# Patient Record
Sex: Female | Born: 1980
Health system: Southern US, Community
[De-identification: ages and names within clinical notes are randomized; demographics above are authoritative.]

## PROBLEM LIST (undated history)

## (undated) DIAGNOSIS — R32 Unspecified urinary incontinence: Secondary | ICD-10-CM

## (undated) DIAGNOSIS — N390 Urinary tract infection, site not specified: Secondary | ICD-10-CM

## (undated) DIAGNOSIS — R011 Cardiac murmur, unspecified: Secondary | ICD-10-CM

## (undated) DIAGNOSIS — K219 Gastro-esophageal reflux disease without esophagitis: Secondary | ICD-10-CM

## (undated) DIAGNOSIS — Z8719 Personal history of other diseases of the digestive system: Secondary | ICD-10-CM

## (undated) HISTORY — DX: Urinary tract infection, site not specified: N39.0

## (undated) HISTORY — DX: Unspecified urinary incontinence: R32

## (undated) HISTORY — PX: LIPOSUCTION: SHX10

## (undated) HISTORY — PX: CYSTOSCOPY: SUR368

## (undated) HISTORY — PX: MYRINGOTOMY: SUR874

## (undated) HISTORY — DX: Cardiac murmur, unspecified: R01.1

## (undated) HISTORY — PX: REDUCTION MAMMAPLASTY: SUR839

---

## 1999-04-06 HISTORY — PX: BREAST REDUCTION SURGERY: SHX8

## 1999-04-06 HISTORY — PX: REDUCTION MAMMAPLASTY: SUR839

## 1999-05-04 ENCOUNTER — Other Ambulatory Visit: Admission: RE | Admit: 1999-05-04 | Discharge: 1999-05-04 | Payer: Self-pay | Admitting: Obstetrics and Gynecology

## 1999-11-16 ENCOUNTER — Other Ambulatory Visit: Admission: RE | Admit: 1999-11-16 | Discharge: 1999-11-16 | Payer: Self-pay | Admitting: Plastic Surgery

## 2000-03-16 ENCOUNTER — Ambulatory Visit (HOSPITAL_COMMUNITY): Admission: RE | Admit: 2000-03-16 | Discharge: 2000-03-16 | Payer: Self-pay | Admitting: Gastroenterology

## 2000-03-16 ENCOUNTER — Encounter: Payer: Self-pay | Admitting: Gastroenterology

## 2000-04-15 ENCOUNTER — Other Ambulatory Visit: Admission: RE | Admit: 2000-04-15 | Discharge: 2000-04-15 | Payer: Self-pay | Admitting: Gastroenterology

## 2000-05-11 ENCOUNTER — Other Ambulatory Visit: Admission: RE | Admit: 2000-05-11 | Discharge: 2000-05-11 | Payer: Self-pay | Admitting: Obstetrics and Gynecology

## 2000-08-11 ENCOUNTER — Emergency Department (HOSPITAL_COMMUNITY): Admission: EM | Admit: 2000-08-11 | Discharge: 2000-08-11 | Payer: Self-pay | Admitting: Internal Medicine

## 2001-05-15 ENCOUNTER — Other Ambulatory Visit: Admission: RE | Admit: 2001-05-15 | Discharge: 2001-05-15 | Payer: Self-pay | Admitting: Obstetrics and Gynecology

## 2001-05-17 ENCOUNTER — Ambulatory Visit (HOSPITAL_COMMUNITY): Admission: RE | Admit: 2001-05-17 | Discharge: 2001-05-17 | Payer: Self-pay | Admitting: Obstetrics and Gynecology

## 2002-06-22 ENCOUNTER — Other Ambulatory Visit: Admission: RE | Admit: 2002-06-22 | Discharge: 2002-06-22 | Payer: Self-pay | Admitting: Obstetrics and Gynecology

## 2003-07-15 ENCOUNTER — Other Ambulatory Visit: Admission: RE | Admit: 2003-07-15 | Discharge: 2003-07-15 | Payer: Self-pay | Admitting: Obstetrics and Gynecology

## 2004-01-16 ENCOUNTER — Other Ambulatory Visit: Admission: RE | Admit: 2004-01-16 | Discharge: 2004-01-16 | Payer: Self-pay | Admitting: Obstetrics and Gynecology

## 2004-08-07 ENCOUNTER — Other Ambulatory Visit: Admission: RE | Admit: 2004-08-07 | Discharge: 2004-08-07 | Payer: Self-pay | Admitting: Obstetrics and Gynecology

## 2006-01-25 ENCOUNTER — Encounter: Admission: RE | Admit: 2006-01-25 | Discharge: 2006-01-25 | Payer: Self-pay | Admitting: Internal Medicine

## 2011-06-21 ENCOUNTER — Other Ambulatory Visit: Payer: Self-pay | Admitting: Obstetrics and Gynecology

## 2012-08-21 ENCOUNTER — Telehealth: Payer: Self-pay | Admitting: Nurse Practitioner

## 2012-08-21 ENCOUNTER — Telehealth: Payer: Self-pay | Admitting: *Deleted

## 2012-08-21 NOTE — Telephone Encounter (Signed)
SEE CALL NOTES !!!

## 2012-08-21 NOTE — Telephone Encounter (Signed)
error 

## 2012-08-22 ENCOUNTER — Encounter: Payer: Self-pay | Admitting: *Deleted

## 2012-08-22 ENCOUNTER — Emergency Department
Admission: EM | Admit: 2012-08-22 | Discharge: 2012-08-22 | Disposition: A | Discharge status: Home / Self Care | Payer: 59 | Source: Home / Self Care | Attending: Family Medicine | Admitting: Family Medicine

## 2012-08-22 DIAGNOSIS — J209 Acute bronchitis, unspecified: Secondary | ICD-10-CM

## 2012-08-22 MED ORDER — BENZONATATE 200 MG PO CAPS: 200.0000 mg | ORAL_CAPSULE | Freq: Every day | ORAL | Status: DC

## 2012-08-22 MED ORDER — DOXYCYCLINE HYCLATE 100 MG PO CAPS: 100.0000 mg | ORAL_CAPSULE | Freq: Two times a day (BID) | ORAL | Status: DC

## 2012-08-22 NOTE — ED Notes (Signed)
Katelyn Mccoy c/o URI x 1 1/2 weeks. Cough is productive, with low grade fever. Taken tylenol cold/sinus

## 2012-08-22 NOTE — ED Provider Notes (Signed)
History     CSN: [id number]  Arrival date & time 08/22/12  0845   First MD Initiated Contact with Patient 08/22/12 [phone]      Chief Complaint  Patient presents with  . URI  . Generalized Body Aches      HPI Comments: Patient complains of approximately 1.5 week history of gradually progressive URI symptoms beginning with a mild sore throat (now resolved), followed by progressive nasal congestion.  A cough started next.  Complains of fatigue and initial myalgias.  Cough is now worse at night and generally non-productive during the day.  There has been no pleuritic pain, but she complains of tightness in her anterior chest.  Over the past two days she has felt worse with increased fatigue and development of low grade fever/chills.  She has developed shortness of breath with activity and wheezing.  She continues to smoke.   The history is provided by the patient.    History reviewed. No pertinent past medical history.  Past Surgical History  Procedure Laterality Date  . Breast reduction surgery    . Myringotomy      Family History  Problem Relation Age of Onset  . Diabetes Other   . Hyperlipidemia Other   . Hypertension Other     History  Substance Use Topics  . Smoking status: Current Every Day Smoker -- 0.50 packs/day    Types: Cigarettes  . Smokeless tobacco: Never Used  . Alcohol Use: Yes    OB History   Grav Para Term Preterm Abortions TAB SAB Ect Mult Living                  Review of Systems + sore throat + cough No pleuritic pain, but has tightness in anterior chest. + wheezing + nasal congestion + post-nasal drainage No sinus pain/pressure No itchy/red eyes No earache No hemoptysis + SOB + low grade fever, + chills No nausea No vomiting No abdominal pain No diarrhea No urinary symptoms No skin rashes + fatigue + myalgias + headache Used OTC meds without relief  Allergies  Sulfur  Home Medications   Current Outpatient Rx  Name  Route   Sig  Dispense  Refill  . omeprazole-sodium bicarbonate (ZEGERID) 40-1100 MG per capsule   Oral   Take 1 capsule by mouth daily before breakfast.         . benzonatate (TESSALON) 200 MG capsule   Oral   Take 1 capsule (200 mg total) by mouth at bedtime.   12 capsule   0   . doxycycline (VIBRAMYCIN) 100 MG capsule   Oral   Take 1 capsule (100 mg total) by mouth 2 (two) times daily.   20 capsule   0     BP 126/84  Pulse 70  Temp(Src) 98 F (36.7 C) (Oral)  Resp 16  Ht 5\' 2"  (1.575 m)  Wt 149 lb (67.586 kg)  BMI 27.25 kg/m2  SpO2 100%  LMP 08/21/2012  Physical Exam Nursing notes and Vital Signs reviewed. Appearance:  Patient appears healthy, stated age, and in no acute distress Eyes:  Pupils are equal, round, and reactive to light and accomodation.  Extraocular movement is intact.  Conjunctivae are not inflamed  Ears:  Canals normal.  Tympanic membranes normal.  Nose:  Mildly congested turbinates.  No sinus tenderness.  Pharynx:  Normal Neck:  Supple.  Slightly tender shotty posterior nodes are palpated bilaterally  Lungs:   Scattered rhonchi.  Breath sounds are equal.  Heart:  Regular rate and rhythm without murmurs, rubs, or gallops.  Abdomen:  Nontender without masses or hepatosplenomegaly.  Bowel sounds are present.  No CVA or flank tenderness.  Extremities:  No edema.  No calf tenderness Skin:  No rash present.    ED Course  Procedures  none      1. Acute bronchitis; suspect initial viral URI now with secondary bacterial infection       MDM  Begin doxycycline.  Prescription written for Benzonatate [facility]) to take at bedtime for night-time cough.  Take plain Mucinex (guaifenesin) twice daily for cough and congestion.  Increase fluid intake, rest. May use Afrin nasal spray (or generic oxymetazoline) twice daily for about 5 days.  Also recommend using saline nasal spray several times daily and saline nasal irrigation (AYR is a common brand) Stop all  antihistamines for now, and other non-prescription cough/cold preparations. Follow-up with family doctor if not improving 7 to 10 days.         Lattie Haw, MD 08/25/12 [phone]

## 2012-08-26 ENCOUNTER — Telehealth: Payer: Self-pay | Admitting: Emergency Medicine

## 2012-10-12 ENCOUNTER — Emergency Department (HOSPITAL_COMMUNITY)
Admission: EM | Admit: 2012-10-12 | Discharge: 2012-10-12 | Disposition: A | Discharge status: Home / Self Care | Payer: 59 | Source: Home / Self Care

## 2013-11-23 DIAGNOSIS — M79609 Pain in unspecified limb: Secondary | ICD-10-CM

## 2014-10-02 ENCOUNTER — Other Ambulatory Visit: Payer: Self-pay | Admitting: Obstetrics and Gynecology

## 2014-10-03 LAB — CYTOLOGY - PAP

## 2014-11-13 ENCOUNTER — Other Ambulatory Visit: Payer: Self-pay | Admitting: Obstetrics and Gynecology

## 2014-11-13 DIAGNOSIS — R928 Other abnormal and inconclusive findings on diagnostic imaging of breast: Secondary | ICD-10-CM

## 2014-11-22 ENCOUNTER — Ambulatory Visit
Admission: RE | Admit: 2014-11-22 | Discharge: 2014-11-22 | Disposition: A | Discharge status: Home / Self Care | Payer: 59 | Source: Ambulatory Visit | Attending: Obstetrics and Gynecology | Admitting: Obstetrics and Gynecology

## 2014-11-22 DIAGNOSIS — R928 Other abnormal and inconclusive findings on diagnostic imaging of breast: Secondary | ICD-10-CM

## 2015-01-09 ENCOUNTER — Telehealth: Payer: Self-pay | Admitting: *Deleted

## 2015-01-09 MED ORDER — METHYLPREDNISOLONE 4 MG PO TBPK: ORAL_TABLET | ORAL | Status: DC

## 2015-01-09 MED ORDER — AZITHROMYCIN 250 MG PO TABS: ORAL_TABLET | ORAL | Status: DC

## 2015-01-09 NOTE — Telephone Encounter (Signed)
Dr. Milinda Pointer ordered Z-pack, and Medrol dose pack 6 days.

## 2015-03-10 ENCOUNTER — Telehealth: Payer: Self-pay | Admitting: Podiatry

## 2015-03-10 MED ORDER — FLUCONAZOLE 150 MG PO TABS: 150.0000 mg | ORAL_TABLET | Freq: Once | ORAL | Status: DC

## 2015-07-03 MED FILL — AZITHROMYCIN 250 MG TABLET: 250 | 5 days supply | Qty: 6 | Fill #0

## 2015-08-21 ENCOUNTER — Other Ambulatory Visit: Payer: Self-pay | Admitting: *Deleted

## 2015-08-21 MED ORDER — ITRACONAZOLE 100 MG PO CAPS: ORAL_CAPSULE | ORAL | Status: DC

## 2015-08-25 DIAGNOSIS — R52 Pain, unspecified: Secondary | ICD-10-CM | POA: Diagnosis not present

## 2015-08-25 DIAGNOSIS — L905 Scar conditions and fibrosis of skin: Secondary | ICD-10-CM | POA: Diagnosis not present

## 2015-08-25 DIAGNOSIS — L304 Erythema intertrigo: Secondary | ICD-10-CM | POA: Diagnosis not present

## 2015-09-26 MED ORDER — AZITHROMYCIN 250 MG PO TABS: ORAL_TABLET | ORAL | Status: DC

## 2015-10-24 ENCOUNTER — Other Ambulatory Visit: Payer: Self-pay

## 2015-10-24 MED ORDER — AZITHROMYCIN 250 MG PO TABS: ORAL_TABLET | ORAL | Status: DC

## 2015-12-05 DIAGNOSIS — L905 Scar conditions and fibrosis of skin: Secondary | ICD-10-CM | POA: Diagnosis not present

## 2015-12-15 ENCOUNTER — Ambulatory Visit (HOSPITAL_COMMUNITY): Payer: Self-pay | Admitting: Plastic Surgery

## 2015-12-15 ENCOUNTER — Other Ambulatory Visit (HOSPITAL_COMMUNITY): Payer: Self-pay | Admitting: Plastic Surgery

## 2015-12-15 DIAGNOSIS — L905 Scar conditions and fibrosis of skin: Secondary | ICD-10-CM | POA: Diagnosis not present

## 2015-12-24 ENCOUNTER — Encounter (HOSPITAL_COMMUNITY): Payer: Self-pay | Admitting: *Deleted

## 2015-12-24 MED ORDER — CEFAZOLIN SODIUM-DEXTROSE 2-4 GM/100ML-% IV SOLN
2.0000 g | INTRAVENOUS | Status: AC
  Administered 2015-12-25: 2 g via INTRAVENOUS
  Filled 2015-12-24: qty 100

## 2015-12-24 MED ORDER — HEPARIN SODIUM (PORCINE) 5000 UNIT/ML IJ SOLN
5000.0000 [IU] | Freq: Once | INTRAMUSCULAR | Status: AC
  Administered 2015-12-25: 5000 [IU] via SUBCUTANEOUS
  Filled 2015-12-24: qty 1

## 2015-12-24 NOTE — Progress Notes (Signed)
Spoke with pt for pre-op call, pt denies cardiac history, chest pain or sob.

## 2015-12-25 ENCOUNTER — Ambulatory Visit (HOSPITAL_COMMUNITY): Payer: 59 | Admitting: Anesthesiology

## 2015-12-25 ENCOUNTER — Encounter (HOSPITAL_COMMUNITY): Payer: Self-pay | Admitting: *Deleted

## 2015-12-25 ENCOUNTER — Encounter (HOSPITAL_COMMUNITY)
Admission: RE | Disposition: A | Discharge status: Home / Self Care | Payer: Self-pay | Source: Ambulatory Visit | Attending: Plastic Surgery

## 2015-12-25 ENCOUNTER — Ambulatory Visit (HOSPITAL_COMMUNITY)
Admission: RE | Admit: 2015-12-25 | Discharge: 2015-12-25 | Disposition: A | Discharge status: Home / Self Care | Payer: 59 | Source: Ambulatory Visit | Attending: Plastic Surgery | Admitting: Plastic Surgery

## 2015-12-25 DIAGNOSIS — Z87891 Personal history of nicotine dependence: Secondary | ICD-10-CM | POA: Insufficient documentation

## 2015-12-25 DIAGNOSIS — K219 Gastro-esophageal reflux disease without esophagitis: Secondary | ICD-10-CM | POA: Insufficient documentation

## 2015-12-25 DIAGNOSIS — L910 Hypertrophic scar: Secondary | ICD-10-CM | POA: Diagnosis not present

## 2015-12-25 DIAGNOSIS — L905 Scar conditions and fibrosis of skin: Secondary | ICD-10-CM | POA: Insufficient documentation

## 2015-12-25 DIAGNOSIS — L304 Erythema intertrigo: Secondary | ICD-10-CM | POA: Diagnosis not present

## 2015-12-25 HISTORY — PX: PANNICULECTOMY: SHX5360

## 2015-12-25 HISTORY — DX: Personal history of other diseases of the digestive system: Z87.19

## 2015-12-25 HISTORY — DX: Gastro-esophageal reflux disease without esophagitis: K21.9

## 2015-12-25 LAB — HCG, SERUM, QUALITATIVE: Preg, Serum: NEGATIVE

## 2015-12-25 LAB — CBC
HCT: 39.8 % (ref 36.0–46.0)
Hemoglobin: 13.4 g/dL (ref 12.0–15.0)
MCH: 29.7 pg (ref 26.0–34.0)
MCHC: 33.7 g/dL (ref 30.0–36.0)
MCV: 88.2 fL (ref 78.0–100.0)
Platelets: 226 10*3/uL (ref 150–400)
RBC: 4.51 MIL/uL (ref 3.87–5.11)
RDW: 12.9 % (ref 11.5–15.5)
WBC: 7.2 10*3/uL (ref 4.0–10.5)

## 2015-12-25 SURGERY — PANNICULECTOMY
Anesthesia: General | Site: Abdomen

## 2015-12-25 MED ORDER — FENTANYL CITRATE (PF) 100 MCG/2ML IJ SOLN
25.0000 ug | INTRAMUSCULAR | Status: DC | PRN
  Administered 2015-12-25 (×2): 50 ug via INTRAVENOUS

## 2015-12-25 MED ORDER — FENTANYL CITRATE (PF) 100 MCG/2ML IJ SOLN
INTRAMUSCULAR | Status: AC
  Administered 2015-12-25: 50 ug via INTRAVENOUS
  Filled 2015-12-25: qty 2

## 2015-12-25 MED ORDER — PROPOFOL 10 MG/ML IV BOLUS
INTRAVENOUS | Status: DC | PRN
Start: 2015-12-25 — End: 2015-12-25
  Administered 2015-12-25: 180 mg via INTRAVENOUS

## 2015-12-25 MED ORDER — 0.9 % SODIUM CHLORIDE (POUR BTL) OPTIME
TOPICAL | Status: DC | PRN
  Administered 2015-12-25: 1000 mL

## 2015-12-25 MED ORDER — LACTATED RINGERS IV SOLN
INTRAVENOUS | Status: DC | PRN
  Administered 2015-12-25 (×2): via INTRAVENOUS

## 2015-12-25 MED ORDER — ONDANSETRON HCL 4 MG/2ML IJ SOLN
INTRAMUSCULAR | Status: DC | PRN
  Administered 2015-12-25: 4 mg via INTRAVENOUS

## 2015-12-25 MED ORDER — MIDAZOLAM HCL 5 MG/5ML IJ SOLN
INTRAMUSCULAR | Status: DC | PRN
  Administered 2015-12-25: 2 mg via INTRAVENOUS

## 2015-12-25 MED ORDER — MIDAZOLAM HCL 2 MG/2ML IJ SOLN
INTRAMUSCULAR | Status: AC
  Filled 2015-12-25: qty 2

## 2015-12-25 MED ORDER — LIDOCAINE 2% (20 MG/ML) 5 ML SYRINGE
INTRAMUSCULAR | Status: AC
  Filled 2015-12-25: qty 5

## 2015-12-25 MED ORDER — PROMETHAZINE HCL 25 MG/ML IJ SOLN: 6.2500 mg | INTRAMUSCULAR | Status: DC | PRN

## 2015-12-25 MED ORDER — FENTANYL CITRATE (PF) 100 MCG/2ML IJ SOLN
INTRAMUSCULAR | Status: AC
  Filled 2015-12-25: qty 4

## 2015-12-25 MED ORDER — CHLORHEXIDINE GLUCONATE CLOTH 2 % EX PADS: 6.0000 | MEDICATED_PAD | Freq: Once | CUTANEOUS | Status: DC

## 2015-12-25 MED ORDER — FENTANYL CITRATE (PF) 100 MCG/2ML IJ SOLN
INTRAMUSCULAR | Status: DC | PRN
  Administered 2015-12-25 (×2): 50 ug via INTRAVENOUS

## 2015-12-25 MED ORDER — LIDOCAINE HCL (CARDIAC) 20 MG/ML IV SOLN
INTRAVENOUS | Status: DC | PRN
  Administered 2015-12-25: 50 mg via INTRAVENOUS

## 2015-12-25 MED ORDER — PROPOFOL 10 MG/ML IV BOLUS
INTRAVENOUS | Status: AC
  Filled 2015-12-25: qty 20

## 2015-12-25 MED ORDER — DEXAMETHASONE SODIUM PHOSPHATE 10 MG/ML IJ SOLN
INTRAMUSCULAR | Status: DC | PRN
  Administered 2015-12-25: 10 mg via INTRAVENOUS

## 2015-12-25 SURGICAL SUPPLY — 67 items
ADH SKN CLS APL DERMABOND .7 (GAUZE/BANDAGES/DRESSINGS) ×1
APPLIER CLIP 9.375 MED OPEN (MISCELLANEOUS)
ATCH SMKEVC FLXB CAUT HNDSWH (FILTER) ×1 IMPLANT
BINDER ABD UNIV 10 28-50 (GAUZE/BANDAGES/DRESSINGS) ×1 IMPLANT
BINDER ABDOM UNIV 10 (GAUZE/BANDAGES/DRESSINGS) ×2
BIOPATCH RED 1 DISK 7.0 (GAUZE/BANDAGES/DRESSINGS) ×2 IMPLANT
BLADE 10 SAFETY STRL DISP (BLADE) IMPLANT
BLADE SURG 15 STRL LF DISP TIS (BLADE) IMPLANT
BLADE SURG 15 STRL SS (BLADE)
BLADE SURG ROTATE 9660 (MISCELLANEOUS) IMPLANT
CANISTER SUCTION 2500CC (MISCELLANEOUS) ×2 IMPLANT
CHLORAPREP W/TINT 26ML (MISCELLANEOUS) ×2 IMPLANT
CLIP APPLIE 9.375 MED OPEN (MISCELLANEOUS) IMPLANT
COVER SURGICAL LIGHT HANDLE (MISCELLANEOUS) ×2 IMPLANT
DERMABOND ADVANCED (GAUZE/BANDAGES/DRESSINGS) ×1
DERMABOND ADVANCED .7 DNX12 (GAUZE/BANDAGES/DRESSINGS) ×1 IMPLANT
DRAIN CHANNEL 19F RND (DRAIN) ×2 IMPLANT
DRAPE LAPAROSCOPIC ABDOMINAL (DRAPES) ×2 IMPLANT
DRAPE ORTHO SPLIT 77X108 STRL (DRAPES)
DRAPE PROXIMA HALF (DRAPES) IMPLANT
DRAPE SURG 17X11 SM STRL (DRAPES) ×4 IMPLANT
DRAPE SURG ORHT 6 SPLT 77X108 (DRAPES) IMPLANT
DRAPE WARM FLUID 44X44 (DRAPE) IMPLANT
DRSG PAD ABDOMINAL 8X10 ST (GAUZE/BANDAGES/DRESSINGS) ×4 IMPLANT
DRSG SORBAVIEW 3.5X5-5/16 MED (GAUZE/BANDAGES/DRESSINGS) ×2 IMPLANT
ELECT CAUTERY BLADE 6.4 (BLADE) ×2 IMPLANT
ELECT REM PT RETURN 9FT ADLT (ELECTROSURGICAL) ×2
ELECTRODE REM PT RTRN 9FT ADLT (ELECTROSURGICAL) ×1 IMPLANT
EVACUATOR SILICONE 100CC (DRAIN) ×2 IMPLANT
EVACUATOR SMOKE ACCUVAC VALLEY (FILTER) ×1
GAUZE SPONGE 4X4 12PLY STRL (GAUZE/BANDAGES/DRESSINGS) IMPLANT
GAUZE XEROFORM 5X9 LF (GAUZE/BANDAGES/DRESSINGS) IMPLANT
GLOVE BIO SURGEON STRL SZ7.5 (GLOVE) ×2 IMPLANT
GLOVE BIOGEL PI IND STRL 6.5 (GLOVE) ×1 IMPLANT
GLOVE BIOGEL PI IND STRL 7.5 (GLOVE) ×1 IMPLANT
GLOVE BIOGEL PI IND STRL 8 (GLOVE) ×1 IMPLANT
GLOVE BIOGEL PI INDICATOR 6.5 (GLOVE) ×1
GLOVE BIOGEL PI INDICATOR 7.5 (GLOVE) ×1
GLOVE BIOGEL PI INDICATOR 8 (GLOVE) ×1
GLOVE ECLIPSE 7.5 STRL STRAW (GLOVE) ×2 IMPLANT
GLOVE SURG SS PI 6.5 STRL IVOR (GLOVE) ×2 IMPLANT
GOWN STRL REUS W/ TWL LRG LVL3 (GOWN DISPOSABLE) ×2 IMPLANT
GOWN STRL REUS W/ TWL XL LVL3 (GOWN DISPOSABLE) ×1 IMPLANT
GOWN STRL REUS W/TWL LRG LVL3 (GOWN DISPOSABLE) ×2
GOWN STRL REUS W/TWL XL LVL3 (GOWN DISPOSABLE) ×1
KIT BASIN OR (CUSTOM PROCEDURE TRAY) ×2 IMPLANT
KIT ROOM TURNOVER OR (KITS) ×2 IMPLANT
NS IRRIG 1000ML POUR BTL (IV SOLUTION) ×2 IMPLANT
PACK GENERAL/GYN (CUSTOM PROCEDURE TRAY) ×2 IMPLANT
PAD ABD 8X10 STRL (GAUZE/BANDAGES/DRESSINGS) ×2 IMPLANT
PAD ARMBOARD 7.5X6 YLW CONV (MISCELLANEOUS) ×2 IMPLANT
PAD SHARPS MAGNETIC DISPOSAL (MISCELLANEOUS) IMPLANT
PREFILTER EVAC NS 1 1/3-3/8IN (MISCELLANEOUS) ×2 IMPLANT
PREFILTER SMOKE EVAC (FILTER) ×2 IMPLANT
SPONGE LAP 18X18 X RAY DECT (DISPOSABLE) IMPLANT
STRIP CLOSURE SKIN 1/2X4 (GAUZE/BANDAGES/DRESSINGS) IMPLANT
SUT MNCRL AB 3-0 PS2 18 (SUTURE) ×8 IMPLANT
SUT MON AB 2-0 CT1 36 (SUTURE) ×4 IMPLANT
SUT PDS AB 0 CT 36 (SUTURE) ×4 IMPLANT
SUT PROLENE 0 CT 1 CR/8 (SUTURE) IMPLANT
SUT PROLENE 3 0 PS 2 (SUTURE) ×2 IMPLANT
SYR BULB IRRIGATION 50ML (SYRINGE) IMPLANT
TOWEL OR 17X24 6PK STRL BLUE (TOWEL DISPOSABLE) ×2 IMPLANT
TOWEL OR 17X26 10 PK STRL BLUE (TOWEL DISPOSABLE) ×2 IMPLANT
TRAY FOLEY CATH 14FRSI W/METER (CATHETERS) IMPLANT
TUBE CONNECTING 12X1/4 (SUCTIONS) ×2 IMPLANT
WATER STERILE IRR 1000ML POUR (IV SOLUTION) IMPLANT

## 2015-12-25 NOTE — Anesthesia Postprocedure Evaluation (Signed)
Anesthesia Post Note  Patient: Katelyn Mccoy  Procedure(s) Performed: Procedure(s) (LRB): ABDOMINAL SCAR REVISION (N/A)  Patient location during evaluation: PACU Anesthesia Type: General Level of consciousness: awake and alert Pain management: pain level controlled Vital Signs Assessment: post-procedure vital signs reviewed and stable Respiratory status: spontaneous breathing, nonlabored ventilation, respiratory function stable and patient connected to nasal cannula oxygen Cardiovascular status: blood pressure returned to baseline and stable Postop Assessment: no signs of nausea or vomiting Anesthetic complications: no    Last Vitals:  Vitals:   12/25/15 0856 12/25/15 0900  BP: 116/82   Pulse:  65  Resp: 19 19  Temp:      Last Pain:  Vitals:   12/25/15 0626  TempSrc: Oral                 Zenaida Deed

## 2015-12-25 NOTE — Brief Op Note (Signed)
12/25/2015  8:52 AM  PATIENT:  Katelyn Mccoy  35 y.o. female  PRE-OPERATIVE DIAGNOSIS:  SCAR  POST-OPERATIVE DIAGNOSIS:  SCAR  PROCEDURE:  Procedure(s): ABDOMINAL SCAR REVISION (N/A)  SURGEON:  Surgeon(s) and Role:    * Crissie Reese, MD - Primary  PHYSICIAN ASSISTANT:   ASSISTANTS: Judyann Munson, RNFA   ANESTHESIA:   general  EBL:  No intake/output data recorded.  BLOOD ADMINISTERED:none  DRAINS: (1) Jackson-Pratt drain(s) with closed bulb suction in the abdomen   LOCAL MEDICATIONS USED:  NONE  SPECIMEN:  No Specimen  DISPOSITION OF SPECIMEN:  N/A  COUNTS:  YES  TOURNIQUET:  * No tourniquets in log *  DICTATION: .Other Dictation: Dictation Number [id number]  PLAN OF CARE: Discharge to home after PACU  PATIENT DISPOSITION:  PACU - hemodynamically stable.   Delay start of Pharmacological VTE agent (>24hrs) due to surgical blood loss or risk of bleeding: not applicable

## 2015-12-25 NOTE — Transfer of Care (Signed)
Immediate Anesthesia Transfer of Care Note  Patient: Katelyn Mccoy  Procedure(s) Performed: Procedure(s): ABDOMINAL SCAR REVISION (N/A)  Patient Location: PACU  Anesthesia Type:General  Level of Consciousness: awake, oriented and patient cooperative  Airway & Oxygen Therapy: Patient Spontanous Breathing and Patient connected to nasal cannula oxygen  Post-op Assessment: Report given to RN and Post -op Vital signs reviewed and stable  Post vital signs: Reviewed  Last Vitals:  Vitals:   12/25/15 0626  BP: 120/82  Pulse: 60  Resp: 18  Temp: 36.8 C    Last Pain:  Vitals:   12/25/15 0626  TempSrc: Oral      Patients Stated Pain Goal: 1 ([id number])  Complications: No apparent anesthesia complications

## 2015-12-25 NOTE — Anesthesia Procedure Notes (Signed)
Procedure Name: LMA Insertion Date/Time: 12/25/2015 7:38 AM Performed by: Luciana Axe K Pre-anesthesia Checklist: Patient identified, Emergency Drugs available, Suction available and Patient being monitored Patient Re-evaluated:Patient Re-evaluated prior to inductionOxygen Delivery Method: Circle System Utilized Preoxygenation: Pre-oxygenation with 100% oxygen Intubation Type: IV induction Ventilation: Mask ventilation without difficulty LMA: LMA inserted LMA Size: 4.0 Number of attempts: 1 Airway Equipment and Method: Bite block Placement Confirmation: positive ETCO2 and breath sounds checked- equal and bilateral Tube secured with: Tape Dental Injury: Teeth and Oropharynx as per pre-operative assessment

## 2015-12-25 NOTE — H&P (Signed)
I have re-examined and re-evaluated the patient and there are no changes.  See office notes in paper chart for H&P. 

## 2015-12-25 NOTE — Anesthesia Preprocedure Evaluation (Addendum)
Anesthesia Evaluation  Patient identified by MRN, date of birth, ID band Patient awake    Reviewed: Allergy & Precautions, H&P , NPO status , Patient's Chart, lab work & pertinent test results  History of Anesthesia Complications Negative for: history of anesthetic complications  Airway Mallampati: II  TM Distance: >3 FB Neck ROM: full    Dental no notable dental hx. (+) Teeth Intact, Dental Advisory Given   Pulmonary neg pulmonary ROS, former smoker,    Pulmonary exam normal breath sounds clear to auscultation       Cardiovascular negative cardio ROS Normal cardiovascular exam Rhythm:regular Rate:Normal     Neuro/Psych negative neurological ROS     GI/Hepatic Neg liver ROS, GERD  Controlled,  Endo/Other  negative endocrine ROS  Renal/GU negative Renal ROS     Musculoskeletal   Abdominal   Peds  Hematology negative hematology ROS (+)   Anesthesia Other Findings   Reproductive/Obstetrics negative OB ROS                            Anesthesia Physical Anesthesia Plan  ASA: I  Anesthesia Plan: General   Post-op Pain Management:    Induction: Intravenous  Airway Management Planned: LMA  Additional Equipment:   Intra-op Plan:   Post-operative Plan:   Informed Consent: I have reviewed the patients History and Physical, chart, labs and discussed the procedure including the risks, benefits and alternatives for the proposed anesthesia with the patient or authorized representative who has indicated his/her understanding and acceptance.   Dental Advisory Given  Plan Discussed with: Anesthesiologist, CRNA and Surgeon  Anesthesia Plan Comments:         Anesthesia Quick Evaluation

## 2015-12-25 NOTE — Discharge Instructions (Addendum)
No lifting for 6 weeks Keep the abdominal binder in place No vigorous activity for 6 weeks (including outdoor walks) No driving for 10 days OK to walk up stairs slowly Stay propped up Use incentive spirometer at home every hour while awake No shower while drains are in place Empty drains at least three times a day and record the amounts separately Take an over-the-counter stool softener (such as Colace) while on pain medication

## 2015-12-26 ENCOUNTER — Encounter (HOSPITAL_COMMUNITY): Payer: Self-pay | Admitting: Plastic Surgery

## 2015-12-26 NOTE — Op Note (Signed)
[name], Katelyn Mccoy             ACCOUNT NO.:  [id number]  MEDICAL RECORD NO.:  [id number]  LOCATION:  [facility]                         FACILITY:  [facility]  PHYSICIAN:  Crissie Reese, M.D.     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  PROCEDURE PERFORMED:  A scar revision, lower abdomen, 40 cm in length.  SURGEON:  Crissie Reese, M.D.  FIRST ASSISTANT:  Judyann Munson, RNFA.  ANESTHESIA:  General.  ESTIMATED BLOOD LOSS:  10 mL.  DRAINS:  119-French.  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  She subsequently has had a tethered scar involuted lower abdomen.  This became increasingly uncomfortable and associated with intertrigo over the years.  She desires scar revision. The nature of the procedure, risks, and possible complications were discussed with her in detail.  She understood risks including, but not limited to bleeding, infection, healing problems, scarring, unacceptable scarring, loss of sensation, fluid accumulations, anesthesia complications, DVT, PE, contour deformities, chronic pain, and overall disappointment.  She understood all this and wished to proceed.  DESCRIPTION OF PROCEDURE:  The patient was marked in the holding area in a full standing position.  She was taken to the operating room and placed supine.  After successful induction of general anesthesia, she was prepped with ChloraPrep and after waiting 3 minutes, she was draped with sterile drapes.  The lower limb of the incision was then made.  The dissection carried down to the subcutaneous tissue and great care taken to avoid damage to underlying abdominal wall.  Dissection was carried cephalad to the limits of the cephalad  markings and then the amputation of this redundant tissue was performed.  Thorough irrigation with saline, meticulous hemostasis with electrocautery.  Excellent hemostasis having been ensured.  A 19-French drain was positioned, brought through separate stab wound, inferolateral on the left side and secured with a 3- 0 plain suture in the area.  The repair of the incision with 2-0 Monocryl interrupted inverted deep sutures, 2-0 and 3- 0 Monocryl interrupted deep dermal sutures, and a running 3-0 Monocryl subcuticular suture.  Dermabond and dry sterile dressings applied. Total length of the scar, 40 cm.     Crissie Reese, M.D.     DB/MEDQ  D:  12/25/2015  T:  12/25/2015  Job:  [id number]

## 2016-01-09 NOTE — Telephone Encounter (Signed)
error 

## 2016-01-12 DIAGNOSIS — L905 Scar conditions and fibrosis of skin: Secondary | ICD-10-CM | POA: Diagnosis not present

## 2016-02-20 NOTE — Telephone Encounter (Signed)
This encounter was created in error - please disregard.

## 2016-03-15 DIAGNOSIS — L905 Scar conditions and fibrosis of skin: Secondary | ICD-10-CM | POA: Diagnosis not present

## 2016-04-16 DIAGNOSIS — Z01419 Encounter for gynecological examination (general) (routine) without abnormal findings: Secondary | ICD-10-CM | POA: Diagnosis not present

## 2016-04-16 DIAGNOSIS — Z6830 Body mass index (BMI) 30.0-30.9, adult: Secondary | ICD-10-CM | POA: Diagnosis not present

## 2016-04-19 LAB — HM PAP SMEAR: HM Pap smear: NEGATIVE

## 2016-05-24 ENCOUNTER — Telehealth: Payer: Self-pay | Admitting: *Deleted

## 2016-05-24 MED ORDER — ITRACONAZOLE 100 MG PO CAPS: 100.0000 mg | ORAL_CAPSULE | Freq: Every day | ORAL | 0 refills | Status: DC

## 2016-05-24 NOTE — Telephone Encounter (Signed)
Dr. Milinda Pointer ordered Itraconazole 100mg  #14 1 capsule bid.

## 2016-09-06 ENCOUNTER — Ambulatory Visit: Payer: Self-pay | Admitting: Podiatry

## 2016-09-27 DIAGNOSIS — H52203 Unspecified astigmatism, bilateral: Secondary | ICD-10-CM | POA: Diagnosis not present

## 2016-09-27 DIAGNOSIS — H5213 Myopia, bilateral: Secondary | ICD-10-CM | POA: Diagnosis not present

## 2016-10-04 ENCOUNTER — Other Ambulatory Visit: Payer: Self-pay | Admitting: Podiatry

## 2016-10-04 MED ORDER — METHYLPREDNISOLONE 4 MG PO TBPK: ORAL_TABLET | ORAL | 0 refills | Status: DC

## 2016-10-28 DIAGNOSIS — M5412 Radiculopathy, cervical region: Secondary | ICD-10-CM | POA: Diagnosis not present

## 2016-10-28 DIAGNOSIS — M5136 Other intervertebral disc degeneration, lumbar region: Secondary | ICD-10-CM | POA: Diagnosis not present

## 2016-10-28 DIAGNOSIS — M25512 Pain in left shoulder: Secondary | ICD-10-CM | POA: Diagnosis not present

## 2016-11-02 DIAGNOSIS — M542 Cervicalgia: Secondary | ICD-10-CM | POA: Diagnosis not present

## 2016-11-05 DIAGNOSIS — M5412 Radiculopathy, cervical region: Secondary | ICD-10-CM | POA: Diagnosis not present

## 2016-11-05 DIAGNOSIS — Z6829 Body mass index (BMI) 29.0-29.9, adult: Secondary | ICD-10-CM | POA: Diagnosis not present

## 2016-11-30 DIAGNOSIS — M5020 Other cervical disc displacement, unspecified cervical region: Secondary | ICD-10-CM | POA: Diagnosis not present

## 2016-11-30 DIAGNOSIS — R030 Elevated blood-pressure reading, without diagnosis of hypertension: Secondary | ICD-10-CM | POA: Diagnosis not present

## 2016-11-30 DIAGNOSIS — Z6829 Body mass index (BMI) 29.0-29.9, adult: Secondary | ICD-10-CM | POA: Diagnosis not present

## 2016-11-30 DIAGNOSIS — M5412 Radiculopathy, cervical region: Secondary | ICD-10-CM | POA: Diagnosis not present

## 2016-12-08 DIAGNOSIS — M5020 Other cervical disc displacement, unspecified cervical region: Secondary | ICD-10-CM | POA: Diagnosis not present

## 2016-12-08 DIAGNOSIS — M5412 Radiculopathy, cervical region: Secondary | ICD-10-CM | POA: Diagnosis not present

## 2016-12-08 DIAGNOSIS — Z6829 Body mass index (BMI) 29.0-29.9, adult: Secondary | ICD-10-CM | POA: Diagnosis not present

## 2017-03-27 ENCOUNTER — Telehealth: Payer: 59 | Admitting: Physician Assistant

## 2017-03-27 DIAGNOSIS — J329 Chronic sinusitis, unspecified: Secondary | ICD-10-CM

## 2017-03-27 DIAGNOSIS — B9789 Other viral agents as the cause of diseases classified elsewhere: Secondary | ICD-10-CM | POA: Diagnosis not present

## 2017-03-27 MED ORDER — FLUTICASONE PROPIONATE 50 MCG/ACT NA SUSP: 2.0000 | Freq: Every day | NASAL | 0 refills | Status: DC

## 2017-03-27 NOTE — Progress Notes (Signed)
We are sorry that you are not feeling well.  Here is how we plan to help!  Based on what you have shared with me it looks like you have sinusitis.  Sinusitis is inflammation and infection in the sinus cavities of the head.  Based on your presentation I believe you most likely have Acute Viral Sinusitis.This is an infection most likely caused by a virus. There is not specific treatment for viral sinusitis other than to help you with the symptoms until the infection runs its course.  You may use an oral decongestant such as Mucinex D or if you have glaucoma or high blood pressure use plain Mucinex. Saline nasal spray help and can safely be used as often as needed for congestion, I have prescribed: Fluticasone nasal spray two sprays in each nostril once a day  Some authorities believe that zinc sprays or the use of Echinacea may shorten the course of your symptoms.  Sinus infections are not as easily transmitted as other respiratory infection, however we still recommend that you avoid close contact with loved ones, especially the very young and elderly.  Remember to wash your hands thoroughly throughout the day as this is the number one way to prevent the spread of infection!  You have mentioned difficulty breathing -- I am assuming this is from nasal congestion. If you are having any issue with shortness of breath, wheezing or inability to breath well due to chest congestion, you need to be seen today ASAP for further assessment.   Home Care:  Only take medications as instructed by your medical team.  Complete the entire course of an antibiotic.  Do not take these medications with alcohol.  A steam or ultrasonic humidifier can help congestion.  You can place a towel over your head and breathe in the steam from hot water coming from a faucet.  Avoid close contacts especially the very young and the elderly.  Cover your mouth when you cough or sneeze.  Always remember to wash your hands.  Get  Help Right Away If:  You develop worsening fever or sinus pain.  You develop a severe head ache or visual changes.  Your symptoms persist after you have completed your treatment plan.  Make sure you  Understand these instructions.  Will watch your condition.  Will get help right away if you are not doing well or get worse.  Your e-visit answers were reviewed by a board certified advanced clinical practitioner to complete your personal care plan.  Depending on the condition, your plan could have included both over the counter or prescription medications.  If there is a problem please reply  once you have received a response from your provider.  Your safety is important to [place].  If you have drug allergies check your prescription carefully.    You can use MyChart to ask questions about today's visit, request a non-urgent call back, or ask for a work or school excuse for 24 hours related to this e-Visit. If it has been greater than 24 hours you will need to follow up with your provider, or enter a new e-Visit to address those concerns.  You will get an e-mail in the next two days asking about your experience.  I hope that your e-visit has been valuable and will speed your recovery. Thank you for using e-visits.

## 2017-04-05 DIAGNOSIS — K52832 Lymphocytic colitis: Secondary | ICD-10-CM

## 2017-04-05 HISTORY — DX: Lymphocytic colitis: K52.832

## 2017-06-02 ENCOUNTER — Other Ambulatory Visit: Payer: Self-pay | Admitting: Podiatry

## 2017-06-02 MED ORDER — ITRACONAZOLE 100 MG PO CAPS: 100.0000 mg | ORAL_CAPSULE | Freq: Two times a day (BID) | ORAL | 1 refills | Status: DC

## 2017-06-06 DIAGNOSIS — Z6829 Body mass index (BMI) 29.0-29.9, adult: Secondary | ICD-10-CM | POA: Diagnosis not present

## 2017-06-06 DIAGNOSIS — Z01419 Encounter for gynecological examination (general) (routine) without abnormal findings: Secondary | ICD-10-CM | POA: Diagnosis not present

## 2017-06-15 ENCOUNTER — Other Ambulatory Visit: Payer: Self-pay | Admitting: Podiatry

## 2017-06-15 MED ORDER — AZITHROMYCIN 250 MG PO TABS: ORAL_TABLET | ORAL | 2 refills | Status: DC

## 2017-08-19 ENCOUNTER — Encounter: Payer: Self-pay | Admitting: Family Medicine

## 2017-08-19 ENCOUNTER — Ambulatory Visit (INDEPENDENT_AMBULATORY_CARE_PROVIDER_SITE_OTHER): Payer: 59 | Admitting: Family Medicine

## 2017-08-19 VITALS — BP 111/75 | HR 61 | Temp 98.0°F | Resp 20 | Ht 63.0 in | Wt 166.5 lb

## 2017-08-19 DIAGNOSIS — E663 Overweight: Secondary | ICD-10-CM

## 2017-08-19 DIAGNOSIS — Z23 Encounter for immunization: Secondary | ICD-10-CM | POA: Diagnosis not present

## 2017-08-19 DIAGNOSIS — Z131 Encounter for screening for diabetes mellitus: Secondary | ICD-10-CM

## 2017-08-19 DIAGNOSIS — Z130 Encounter for screening for diseases of the blood and blood-forming organs and certain disorders involving the immune mechanism: Secondary | ICD-10-CM

## 2017-08-19 DIAGNOSIS — G4700 Insomnia, unspecified: Secondary | ICD-10-CM | POA: Diagnosis not present

## 2017-08-19 DIAGNOSIS — Z7689 Persons encountering health services in other specified circumstances: Secondary | ICD-10-CM

## 2017-08-19 DIAGNOSIS — Z0000 Encounter for general adult medical examination without abnormal findings: Secondary | ICD-10-CM

## 2017-08-19 DIAGNOSIS — K5901 Slow transit constipation: Secondary | ICD-10-CM | POA: Diagnosis not present

## 2017-08-19 HISTORY — DX: Insomnia, unspecified: G47.00

## 2017-08-19 NOTE — Patient Instructions (Signed)
Health Maintenance, Female Adopting a healthy lifestyle and getting preventive care can go a long way to promote health and wellness. Talk with your health care provider about what schedule of regular examinations is right for you. This is a good chance for you to check in with your provider about disease prevention and staying healthy. In between checkups, there are plenty of things you can do on your own. Experts have done a lot of research about which lifestyle changes and preventive measures are most likely to keep you healthy. Ask your health care provider for more information. Weight and diet Eat a healthy diet  Be sure to include plenty of vegetables, fruits, low-fat dairy products, and lean protein.  Do not eat a lot of foods high in solid fats, added sugars, or salt.  Get regular exercise. This is one of the most important things you can do for your health. ? Most adults should exercise for at least 150 minutes each week. The exercise should increase your heart rate and make you sweat (moderate-intensity exercise). ? Most adults should also do strengthening exercises at least twice a week. This is in addition to the moderate-intensity exercise.  Maintain a healthy weight  Body mass index (BMI) is a measurement that can be used to identify possible weight problems. It estimates body fat based on height and weight. Your health care provider can help determine your BMI and help you achieve or maintain a healthy weight.  For females 20 years of age and older: ? A BMI below 18.5 is considered underweight. ? A BMI of 18.5 to 24.9 is normal. ? A BMI of 25 to 29.9 is considered overweight. ? A BMI of 30 and above is considered obese.  Watch levels of cholesterol and blood lipids  You should start having your blood tested for lipids and cholesterol at 39 years of age, then have this test every 5 years.  You may need to have your cholesterol levels checked more often if: ? Your lipid or  cholesterol levels are high. ? You are older than 39 years of age. ? You are at high risk for heart disease.  Cancer screening Lung Cancer  Lung cancer screening is recommended for adults 55-80 years old who are at high risk for lung cancer because of a history of smoking.  A yearly low-dose CT scan of the lungs is recommended for people who: ? Currently smoke. ? Have quit within the past 15 years. ? Have at least a 30-pack-year history of smoking. A pack year is smoking an average of one pack of cigarettes a day for 1 year.  Yearly screening should continue until it has been 15 years since you quit.  Yearly screening should stop if you develop a health problem that would prevent you from having lung cancer treatment.  Breast Cancer  Practice breast self-awareness. This means understanding how your breasts normally appear and feel.  It also means doing regular breast self-exams. Let your health care provider know about any changes, no matter how small.  If you are in your 20s or 30s, you should have a clinical breast exam (CBE) by a health care provider every 1-3 years as part of a regular health exam.  If you are 40 or older, have a CBE every year. Also consider having a breast X-ray (mammogram) every year.  If you have a family history of breast cancer, talk to your health care provider about genetic screening.  If you are at high risk   for breast cancer, talk to your health care provider about having an MRI and a mammogram every year.  Breast cancer gene (BRCA) assessment is recommended for women who have family members with BRCA-related cancers. BRCA-related cancers include: ? Breast. ? Ovarian. ? Tubal. ? Peritoneal cancers.  Results of the assessment will determine the need for genetic counseling and BRCA1 and BRCA2 testing.  Cervical Cancer Your health care provider may recommend that you be screened regularly for cancer of the pelvic organs (ovaries, uterus, and  vagina). This screening involves a pelvic examination, including checking for microscopic changes to the surface of your cervix (Pap test). You may be encouraged to have this screening done every 3 years, beginning at age 22.  For women ages 56-65, health care providers may recommend pelvic exams and Pap testing every 3 years, or they may recommend the Pap and pelvic exam, combined with testing for human papilloma virus (HPV), every 5 years. Some types of HPV increase your risk of cervical cancer. Testing for HPV may also be done on women of any age with unclear Pap test results.  Other health care providers may not recommend any screening for nonpregnant women who are considered low risk for pelvic cancer and who do not have symptoms. Ask your health care provider if a screening pelvic exam is right for you.  If you have had past treatment for cervical cancer or a condition that could lead to cancer, you need Pap tests and screening for cancer for at least 20 years after your treatment. If Pap tests have been discontinued, your risk factors (such as having a new sexual partner) need to be reassessed to determine if screening should resume. Some women have medical problems that increase the chance of getting cervical cancer. In these cases, your health care provider may recommend more frequent screening and Pap tests.  Colorectal Cancer  This type of cancer can be detected and often prevented.  Routine colorectal cancer screening usually begins at 39 years of age and continues through 39 years of age.  Your health care provider may recommend screening at an earlier age if you have risk factors for colon cancer.  Your health care provider may also recommend using home test kits to check for hidden blood in the stool.  A small camera at the end of a tube can be used to examine your colon directly (sigmoidoscopy or colonoscopy). This is done to check for the earliest forms of colorectal  cancer.  Routine screening usually begins at age 33.  Direct examination of the colon should be repeated every 5-10 years through 39 years of age. However, you may need to be screened more often if early forms of precancerous polyps or small growths are found.  Skin Cancer  Check your skin from head to toe regularly.  Tell your health care provider about any new moles or changes in moles, especially if there is a change in a mole's shape or color.  Also tell your health care provider if you have a mole that is larger than the size of a pencil eraser.  Always use sunscreen. Apply sunscreen liberally and repeatedly throughout the day.  Protect yourself by wearing long sleeves, pants, a wide-brimmed hat, and sunglasses whenever you are outside.  Heart disease, diabetes, and high blood pressure  High blood pressure causes heart disease and increases the risk of stroke. High blood pressure is more likely to develop in: ? People who have blood pressure in the high end of  the normal range (130-139/85-89 mm Hg). ? People who are overweight or obese. ? People who are African American.  If you are 21-29 years of age, have your blood pressure checked every 3-5 years. If you are 3 years of age or older, have your blood pressure checked every year. You should have your blood pressure measured twice-once when you are at a hospital or clinic, and once when you are not at a hospital or clinic. Record the average of the two measurements. To check your blood pressure when you are not at a hospital or clinic, you can use: ? An automated blood pressure machine at a pharmacy. ? A home blood pressure monitor.  If you are between 17 years and 37 years old, ask your health care provider if you should take aspirin to prevent strokes.  Have regular diabetes screenings. This involves taking a blood sample to check your fasting blood sugar level. ? If you are at a normal weight and have a low risk for diabetes,  have this test once every three years after 39 years of age. ? If you are overweight and have a high risk for diabetes, consider being tested at a younger age or more often. Preventing infection Hepatitis B  If you have a higher risk for hepatitis B, you should be screened for this virus. You are considered at high risk for hepatitis B if: ? You were born in a country where hepatitis B is common. Ask your health care provider which countries are considered high risk. ? Your parents were born in a high-risk country, and you have not been immunized against hepatitis B (hepatitis B vaccine). ? You have HIV or AIDS. ? You use needles to inject street drugs. ? You live with someone who has hepatitis B. ? You have had sex with someone who has hepatitis B. ? You get hemodialysis treatment. ? You take certain medicines for conditions, including cancer, organ transplantation, and autoimmune conditions.  Hepatitis C  Blood testing is recommended for: ? Everyone born from [birth year] through 1967. ? Anyone with known risk factors for hepatitis C.  Sexually transmitted infections (STIs)  You should be screened for sexually transmitted infections (STIs) including gonorrhea and chlamydia if: ? You are sexually active and are younger than 39 years of age. ? You are older than 39 years of age and your health care provider tells you that you are at risk for this type of infection. ? Your sexual activity has changed since you were last screened and you are at an increased risk for chlamydia or gonorrhea. Ask your health care provider if you are at risk.  If you do not have HIV, but are at risk, it may be recommended that you take a prescription medicine daily to prevent HIV infection. This is called pre-exposure prophylaxis (PrEP). You are considered at risk if: ? You are sexually active and do not regularly use condoms or know the HIV status of your partner(s). ? You take drugs by injection. ? You are  sexually active with a partner who has HIV.  Talk with your health care provider about whether you are at high risk of being infected with HIV. If you choose to begin PrEP, you should first be tested for HIV. You should then be tested every 3 months for as long as you are taking PrEP. Pregnancy  If you are premenopausal and you may become pregnant, ask your health care provider about preconception counseling.  If you may become  pregnant, take 400 to 800 micrograms (mcg) of folic acid every day.  If you want to prevent pregnancy, talk to your health care provider about birth control (contraception). Osteoporosis and menopause  Osteoporosis is a disease in which the bones lose minerals and strength with aging. This can result in serious bone fractures. Your risk for osteoporosis can be identified using a bone density scan.  If you are 65 years of age or older, or if you are at risk for osteoporosis and fractures, ask your health care provider if you should be screened.  Ask your health care provider whether you should take a calcium or vitamin D supplement to lower your risk for osteoporosis.  Menopause may have certain physical symptoms and risks.  Hormone replacement therapy may reduce some of these symptoms and risks. Talk to your health care provider about whether hormone replacement therapy is right for you. Follow these instructions at home:  Schedule regular health, dental, and eye exams.  Stay current with your immunizations.  Do not use any tobacco products including cigarettes, chewing tobacco, or electronic cigarettes.  If you are pregnant, do not drink alcohol.  If you are breastfeeding, limit how much and how often you drink alcohol.  Limit alcohol intake to no more than 1 drink per day for nonpregnant women. One drink equals 12 ounces of beer, 5 ounces of wine, or 1 ounces of hard liquor.  Do not use street drugs.  Do not share needles.  Ask your health care  provider for help if you need support or information about quitting drugs.  Tell your health care provider if you often feel depressed.  Tell your health care provider if you have ever been abused or do not feel safe at home. This information is not intended to replace advice given to you by your health care provider. Make sure you discuss any questions you have with your health care provider. Document Released: 10/05/2010 Document Revised: 08/28/2015 Document Reviewed: 12/24/2014 Elsevier Interactive Patient Education  2018 [employer]  Please help us help you:  We are honored you have chosen [facility] for your Primary Care home. Below you will find basic instructions that you may need to access in the future. Please help us help you by reading the instructions, which cover many of the frequent questions we experience.   Prescription refills and request:  -In order to allow more efficient response time, please call your pharmacy for all refills. They will forward the request electronically to us. This allows for the quickest possible response. Request left on a nurse line can take longer to refill, since these are checked as time allows between office patients and other phone calls.  - refill request can take up to 3-5 working days to complete.  - If request is sent electronically and request is appropiate, it is usually completed in 1-2 business days.  - all patients will need to be seen routinely for all chronic medical conditions requiring prescription medications (see follow-up below). If you are overdue for follow up on your condition, you will be asked to make an appointment and we will call in enough medication to cover you until your appointment (up to 30 days).  - all controlled substances will require a face to face visit to request/refill.  - if you desire your prescriptions to go through a new pharmacy, and have an active script at original pharmacy, you will need to call  your pharmacy and have scripts transferred to new   pharmacy. This is completed between the pharmacy locations and not by your provider.    Results: If any images or labs were ordered, it can take up to 1 week to get results depending on the test ordered and the lab/facility running and resulting the test. - Normal or stable results, which do not need further discussion, may be released to your mychart immediately with attached note to you. A call may not be generated for normal results. Please make certain to sign up for mychart. If you have questions on how to activate your mychart you can call the front office.  - If your results need further discussion, our office will attempt to contact you via phone, and if unable to reach you after 2 attempts, we will release your abnormal result to your mychart with instructions.  - All results will be automatically released in mychart after 1 week.  - Your provider will provide you with explanation and instruction on all relevant material in your results. Please keep in mind, results and labs may appear confusing or abnormal to the untrained eye, but it does not mean they are actually abnormal for you personally. If you have any questions about your results that are not covered, or you desire more detailed explanation than what was provided, you should make an appointment with your provider to do so.   Our office handles many outgoing and incoming calls daily. If we have not contacted you within 1 week about your results, please check your mychart to see if there is a message first and if not, then contact our office.  In helping with this matter, you help decrease call volume, and therefore allow us to be able to respond to patients needs more efficiently.   Acute office visits (sick visit):  An acute visit is intended for a new problem and are scheduled in shorter time slots to allow schedule openings for patients with new problems. This is the appropriate visit  to discuss a new problem. Problems will not be addressed by phone call or Echart message. Appointment is needed if requesting treatment. In order to provide you with excellent quality medical care with proper time for you to explain your problem, have an exam and receive treatment with instructions, these appointments should be limited to one new problem per visit. If you experience a new problem, in which you desire to be addressed, please make an acute office visit, we save openings on the schedule to accommodate you. Please do not save your new problem for any other type of visit, let us take care of it properly and quickly for you.   Follow up visits:  Depending on your condition(s) your provider will need to see you routinely in order to provide you with quality care and prescribe medication(s). Most chronic conditions (Example: hypertension, Diabetes, depression/anxiety... etc), require visits a couple times a year. Your provider will instruct you on proper follow up for your personal medical conditions and history. Please make certain to make follow up appointments for your condition as instructed. Failing to do so could result in lapse in your medication treatment/refills. If you request a refill, and are overdue to be seen on a condition, we will always provide you with a 30 day script (once) to allow you time to schedule.    Medicare wellness (well visit): - we have a wonderful Nurse (Kim), that will meet with you and provide you will yearly medicare wellness visits. These visits should occur yearly (can not   be scheduled less than 1 calendar year apart) and cover preventive health, immunizations, advance directives and screenings you are entitled to yearly through your medicare benefits. Do not miss out on your entitled benefits, this is when medicare will pay for these benefits to be ordered for you.  These are strongly encouraged by your provider and is the appropriate type of visit to make  certain you are up to date with all preventive health benefits. If you have not had your medicare wellness exam in the last 12 months, please make certain to schedule one by calling the office and schedule your medicare wellness with Kim as soon as possible.   Yearly physical (well visit):  - Adults are recommended to be seen yearly for physicals. Check with your insurance and date of your last physical, most insurances require one calendar year between physicals. Physicals include all preventive health topics, screenings, medical exam and labs that are appropriate for gender/age and history. You may have fasting labs needed at this visit. This is a well visit (not a sick visit), new problems should not be covered during this visit (see acute visit).  - Pediatric patients are seen more frequently when they are younger. Your provider will advise you on well child visit timing that is appropriate for your their age. - This is not a medicare wellness visit. Medicare wellness exams do not have an exam portion to the visit. Some medicare companies allow for a physical, some do not allow a yearly physical. If your medicare allows a yearly physical you can schedule the medicare wellness with our nurse Kim and have your physical with your provider after, on the same day. Please check with insurance for your full benefits.   Late Policy/No Shows:  - all new patients should arrive 15-30 minutes earlier than appointment to allow us time  to  obtain all personal demographics,  insurance information and for you to complete office paperwork. - All established patients should arrive 10-15 minutes earlier than appointment time to update all information and be checked in .  - In our best efforts to run on time, if you are late for your appointment you will be asked to either reschedule or if able, we will work you back into the schedule. There will be a wait time to work you back in the schedule,  depending on  availability.  - If you are unable to make it to your appointment as scheduled, please call 24 hours ahead of time to allow us to fill the time slot with someone else who needs to be seen. If you do not cancel your appointment ahead of time, you may be charged a no show fee.   

## 2017-08-19 NOTE — Progress Notes (Signed)
Patient ID: Katelyn Mccoy, female  DOB: December 23, 1980, 37 y.o.   MRN: [id number] Patient Care Team    Relationship Specialty Notifications Start End  Ma Hillock, DO PCP - General Family Medicine  08/19/17   Molli Posey, MD Consulting Physician Obstetrics and Gynecology  08/19/17     Chief Complaint  Patient presents with  . Establish Care    Subjective:  Katelyn Mccoy is a 37 y.o.  female present for new patient establishment. All past medical history, surgical history, allergies, family history, immunizations, medications and social history were obtained and entered in the electronic medical record today. All recent labs, ED visits and hospitalizations within the last year were reviewed.  Insomnia: we briefly touched base on her Ambien use today. Her gyn as provided in the past. If she desires we take over script or she would like to be evaluated and try another option, she will need to make an additional appt since Ambien is a controlled substance and this is her establishment appt   Health maintenance:  Colonoscopy: no fhx, routine screen Mammogram: No fhx, routine screen. SBE encouraged Cervical cancer screening: last pap: 06/2017, results: normal, completed by: GYN- Dr. Matthew Saras Patient's last menstrual period was 08/18/2017. Immunizations: td- updated today, Influenza 2018(encouraged yearly) Infectious disease screening: HIV completed w/ preg DEXA: routine screen Assistive device: none Oxygen IRC:VELF Patient has a Dental home. Hospitalizations/ED visits:  Depression screen [facility] 2/9 08/19/2017  Decreased Interest 0  Down, Depressed, Hopeless 0  PHQ - 2 Score 0   No flowsheet data found.  Current Exercise Habits: The patient does not participate in regular exercise at present Exercise limited by: None identified Fall Risk  08/19/2017  Falls in the past year? No    Immunization History  Administered Date(s) Administered  . Influenza-Unspecified  01/05/2017  . Td 08/19/2017    No exam data present  Past Medical History:  Diagnosis Date  . Frequent UTI   . GERD (gastroesophageal reflux disease)   . Heart murmur   . History of IBS   . Urinary incontinence    Allergies  Allergen Reactions  . Sulfa Antibiotics Rash and Hives   Past Surgical History:  Procedure Laterality Date  . BREAST REDUCTION SURGERY  2001  . CYSTOSCOPY  1980's   multiple.   Marland Kitchen MYRINGOTOMY    . PANNICULECTOMY N/A 12/25/2015   Procedure: ABDOMINAL SCAR REVISION;  Surgeon: Crissie Reese, MD;  Location: [place];  Service: Plastics;  Laterality: N/A;   Family History  Problem Relation Age of Onset  . Diabetes Other   . Hyperlipidemia Other   . Hypertension Other    Social History   Socioeconomic History  . Marital status: Divorced    Spouse name: Not on file  . Number of children: Not on file  . Years of education: Not on file  . Highest education level: Not on file  Occupational History  . Occupation: CMA  Social Needs  . Financial resource strain: Not on file  . Food insecurity:    Worry: Not on file    Inability: Not on file  . Transportation needs:    Medical: Not on file    Non-medical: Not on file  Tobacco Use  . Smoking status: Current Every Day Smoker    Packs/day: 0.50    Years: 5.00    Pack years: 2.50    Types: Cigarettes    Last attempt to quit: 12/10/2015    Years since quitting:  1.6  . Smokeless tobacco: Never Used  Substance and Sexual Activity  . Alcohol use: Yes    Comment: occasional  . Drug use: No  . Sexual activity: Yes    Partners: Female  Lifestyle  . Physical activity:    Days per week: Not on file    Minutes per session: Not on file  . Stress: Not on file  Relationships  . Social connections:    Talks on phone: Not on file    Gets together: Not on file    Attends religious service: Not on file    Active member of club or organization: Not on file    Attends meetings of clubs or organizations: Not on  file    Relationship status: Not on file  . Intimate partner violence:    Fear of current or ex partner: Not on file    Emotionally abused: Not on file    Physically abused: Not on file    Forced sexual activity: Not on file  Other Topics Concern  . Not on file  Social History Narrative   Divorced, 1 child.    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 Always use your most recent med list.          fluticasone 50 MCG/ACT nasal spray Commonly known as:  FLONASE Place 2 sprays into both nostrils daily.   Melatonin 5 MG Tabs   omeprazole-sodium bicarbonate 40-1100 MG capsule Commonly known as:  ZEGERID Take 1 capsule by mouth daily before breakfast.   zolpidem 10 MG tablet Commonly known as:  AMBIEN Take 10 mg by mouth at bedtime as needed for sleep. for sleep       All past medical history, surgical history, allergies, family history, immunizations andmedications were updated in the EMR today and reviewed under the history and medication portions of their EMR.    No results found for this or any previous visit (from the past 2160 hour(s)).  No results found.   ROS: 14 pt review of systems performed and negative (unless mentioned in an HPI)  Objective: BP 111/75 (BP Location: Left Arm, Patient Position: Sitting, Cuff Size: Large)   Pulse 61   Temp 98 F (36.7 C)   Resp 20   Ht _0  (1.6 m)   Wt 166 lb 8 oz (75.5 kg)   LMP 08/18/2017   SpO2 98%   BMI 29.49 kg/m  Gen: Afebrile. No acute distress. Nontoxic in appearance, well-developed, well-nourished,  Overweight, pleasant caucasian female.  HENT: AT. [place]. Bilateral TM visualized and normal in appearance, normal external auditory canal. MMM, no oral lesions, adequate dentition. Bilateral nares within  normal limits. Throat without erythema, ulcerations or exudates. no Cough on exam, no hoarseness on exam. Eyes:Pupils Equal Round Reactive to light, Extraocular movements intact,  Conjunctiva without redness, discharge or icterus. Neck/lymp/endocrine: Supple,no lymphadenopathy, no thyromegaly CV: RRR no murmur, noedema, +2/4 P posterior tibialis pulses. no carotid bruits. No JVD. Chest: CTAB, no wheeze, rhonchi or crackles. normal Respiratory effort. good Air movement. Abd: Soft. flat. NTND. BS present. no Masses palpated. No hepatosplenomegaly. No rebound tenderness or guarding. Skin: no rashes, purpura or petechiae. Warm  and well-perfused. Skin intact. Neuro/Msk:  Normal gait. PERLA. EOMi. Alert. Oriented x3.  Cranial nerves II through XII intact. Muscle strength 5/5 upper/lower extremity. DTRs equal bilaterally. Psych: Normal affect, dress and demeanor. Normal speech. Normal thought content and judgment.   Assessment/plan: TERILYNN BURESH is a 37 y.o. female present for CPE. Slow transit constipation - start daily mag supplement OTC - Comp Met (CMET); Future - TSH; Future Insomnia:  - if desiring management here, will need an appt to discuss. Ambien use infrequently (script of 30 lasted about a yr per pt) - continue melatonin.  Screening for deficiency anemia - CBC w/Diff; Future Lipid screening - Lipid panel; Future Diabetes mellitus screening - HgB A1c; Future Immunization due - Td : Tetanus/diphtheria >7yo Preservative  free Encounter for preventive health examination Patient was encouraged to exercise greater than 150 minutes a week. Patient was encouraged to choose a diet filled with fresh fruits and vegetables, and lean meats. AVS provided to patient today for education/recommendation on gender specific health and safety maintenance. Colonoscopy: no fhx, routine screen Mammogram: No fhx, routine screen. SBE encouraged Cervical cancer screening: last pap: 06/2017, results:  normal, completed by: GYN- Dr. Matthew Saras Patient's last menstrual period was 08/18/2017. Immunizations: td- updated today, Influenza 2018(encouraged yearly) Infectious disease screening: HIV completed w/ preg DEXA: routine screen   Return in about 1 year (around 08/20/2018) for CPE.   Note is dictated utilizing voice recognition software. Although note has been proof read prior to signing, occasional typographical errors still can be missed. If any questions arise, please do not hesitate to call for verification.  Electronically signed by: Howard Pouch, DO [place]

## 2017-08-22 ENCOUNTER — Ambulatory Visit (INDEPENDENT_AMBULATORY_CARE_PROVIDER_SITE_OTHER): Payer: 59 | Admitting: Family Medicine

## 2017-08-22 ENCOUNTER — Other Ambulatory Visit: Payer: 59

## 2017-08-22 ENCOUNTER — Encounter: Payer: Self-pay | Admitting: Family Medicine

## 2017-08-22 VITALS — BP 106/72 | HR 68 | Temp 97.9°F | Resp 20 | Ht 63.0 in | Wt 164.5 lb

## 2017-08-22 DIAGNOSIS — K5901 Slow transit constipation: Secondary | ICD-10-CM

## 2017-08-22 DIAGNOSIS — G4700 Insomnia, unspecified: Secondary | ICD-10-CM

## 2017-08-22 DIAGNOSIS — E663 Overweight: Secondary | ICD-10-CM

## 2017-08-22 DIAGNOSIS — Z131 Encounter for screening for diabetes mellitus: Secondary | ICD-10-CM

## 2017-08-22 DIAGNOSIS — Z130 Encounter for screening for diseases of the blood and blood-forming organs and certain disorders involving the immune mechanism: Secondary | ICD-10-CM

## 2017-08-22 LAB — COMPREHENSIVE METABOLIC PANEL
ALT: 15 U/L (ref 0–35)
AST: 14 U/L (ref 0–37)
Albumin: 4.4 g/dL (ref 3.5–5.2)
Alkaline Phosphatase: 72 U/L (ref 39–117)
BILIRUBIN TOTAL: 0.6 mg/dL (ref 0.2–1.2)
BUN: 15 mg/dL (ref 6–23)
CO2: 27 meq/L (ref 19–32)
Calcium: 9.5 mg/dL (ref 8.4–10.5)
Chloride: 105 mEq/L (ref 96–112)
Creatinine, Ser: 0.78 mg/dL (ref 0.40–1.20)
GFR: 87.56 mL/min (ref >60.00)
GLUCOSE: 95 mg/dL (ref 70–99)
Potassium: 4 mEq/L (ref 3.5–5.1)
SODIUM: 140 meq/L (ref 135–145)
TOTAL PROTEIN: 6.8 g/dL (ref 6.0–8.3)

## 2017-08-22 LAB — LIPID PANEL
CHOL/HDL RATIO: 3
Cholesterol: 177 mg/dL (ref 0–200)
HDL: 62.4 mg/dL (ref >39.00)
LDL Cholesterol: 101 mg/dL — ABNORMAL HIGH (ref 0–99)
NONHDL: 114.71
Triglycerides: 70 mg/dL (ref 0.0–149.0)
VLDL: 14 mg/dL (ref 0.0–40.0)

## 2017-08-22 LAB — CBC WITH DIFFERENTIAL/PLATELET
BASOS ABS: 0 10*3/uL (ref 0.0–0.1)
Basophils Relative: 0.7 % (ref 0.0–3.0)
EOS PCT: 8.6 % — AB (ref 0.0–5.0)
Eosinophils Absolute: 0.6 10*3/uL (ref 0.0–0.7)
HEMATOCRIT: 43.8 % (ref 36.0–46.0)
Hemoglobin: 15.1 g/dL — ABNORMAL HIGH (ref 12.0–15.0)
LYMPHS PCT: 36.2 % (ref 12.0–46.0)
Lymphs Abs: 2.6 10*3/uL (ref 0.7–4.0)
MCHC: 34.5 g/dL (ref 30.0–36.0)
MCV: 89.6 fl (ref 78.0–100.0)
MONOS PCT: 5.7 % (ref 3.0–12.0)
Monocytes Absolute: 0.4 10*3/uL (ref 0.1–1.0)
NEUTROS ABS: 3.5 10*3/uL (ref 1.4–7.7)
Neutrophils Relative %: 48.8 % (ref 43.0–77.0)
PLATELETS: 284 10*3/uL (ref 150.0–400.0)
RBC: 4.89 Mil/uL (ref 3.87–5.11)
RDW: 13.6 % (ref 11.5–15.5)
WBC: 7.2 10*3/uL (ref 4.0–10.5)

## 2017-08-22 LAB — HEMOGLOBIN A1C: HEMOGLOBIN A1C: 5.1 % (ref 4.6–6.5)

## 2017-08-22 LAB — TSH: TSH: 1.46 u[IU]/mL (ref 0.35–4.50)

## 2017-08-22 MED ORDER — TRAZODONE HCL 50 MG PO TABS: 25.0000 mg | ORAL_TABLET | Freq: Every evening | ORAL | 0 refills | Status: DC | PRN

## 2017-08-22 NOTE — Progress Notes (Signed)
Katelyn Mccoy , Dec 30, 1980, 37 y.o., female MRN: [id number] Patient Care Team    Relationship Specialty Notifications Start End  Ma Hillock, DO PCP - General Family Medicine  08/19/17   Molli Posey, MD Consulting Physician Obstetrics and Gynecology  08/19/17     Chief Complaint  Patient presents with  . Insomnia     Subjective: Pt presents for an OV with complaints of insomnia of 2-3 year duration.  Associated symptoms include unable to fall asleep and sometimes does not stay asleep.Her OB/GYN had started her ambien at that time. Ambien can give her a sleep a=hangover feeling and hits her abruptly.  Sleep hygiene: she goes to bed about 10-130 pm every night and wakes at 5-530 am. On the weekends she sleeps until about 7 am. She also uses melatonin nightly.   Depression screen PHQ 2/9 08/19/2017  Decreased Interest 0  Down, Depressed, Hopeless 0  PHQ - 2 Score 0    Allergies  Allergen Reactions  . Sulfa Antibiotics Rash and Hives   Social History   Tobacco Use  . Smoking status: Current Every Day Smoker    Packs/day: 0.50    Years: 5.00    Pack years: 2.50    Types: Cigarettes  . Smokeless tobacco: Never Used  Substance Use Topics  . Alcohol use: Yes    Comment: occasional   Past Medical History:  Diagnosis Date  . Frequent UTI   . GERD (gastroesophageal reflux disease)   . Heart murmur   . History of IBS   . Urinary incontinence    Past Surgical History:  Procedure Laterality Date  . BREAST REDUCTION SURGERY  2001  . CYSTOSCOPY  1980's   multiple.   Marland Kitchen MYRINGOTOMY    . PANNICULECTOMY N/A 12/25/2015   Procedure: ABDOMINAL SCAR REVISION;  Surgeon: Crissie Reese, MD;  Location: [place];  Service: Plastics;  Laterality: N/A;   Family History  Problem Relation Age of Onset  . Diabetes Other   . Hyperlipidemia Other   . Hypertension Other    Allergies as of 08/22/2017      Reactions   Sulfa Antibiotics Rash, Hives      Medication List        Accurate as of 08/22/17 10:06 AM. Always use your most recent med list.          fluticasone 50 MCG/ACT nasal spray Commonly known as:  FLONASE Place 2 sprays into both nostrils daily.   Melatonin 5 MG Tabs   omeprazole-sodium bicarbonate 40-1100 MG capsule Commonly known as:  ZEGERID Take 1 capsule by mouth daily before breakfast.   traZODone 50 MG tablet Commonly known as:  DESYREL Take 0.5-1 tablets (25-50 mg total) by mouth at bedtime as needed for sleep.   zolpidem 10 MG tablet Commonly known as:  AMBIEN Take 10 mg by mouth at bedtime as needed for sleep. for sleep       All past medical history, surgical history, allergies, family history, immunizations andmedications were updated in the EMR today and reviewed under the history and medication portions of their EMR.     ROS: Negative, with the exception of above mentioned in HPI   Objective:  BP 106/72 (BP Location: Left Arm, Patient Position: Sitting, Cuff Size: Large)   Pulse 68   Temp 97.9 F (36.6 C)   Resp 20   Ht 5\' 3"  (1.6 m)   Wt 164 lb 8 oz (74.6 kg)   LMP 08/18/2017  SpO2 98%   BMI 29.14 kg/m  Body mass index is 29.14 kg/m. Gen: Afebrile. No acute distress. Nontoxic in appearance, well developed, well nourished.  HENT: AT. [place].MMM Eyes:Pupils Equal Round Reactive to light, Extraocular movements intact,  Conjunctiva without redness, discharge or icterus. CV: RRR Chest: CTAB, no wheeze or crackles.  Neuro:  Normal gait. PERLA. EOMi. Alert. Oriented x3  Psych: Normal affect, dress and demeanor. Normal speech. Normal thought content and judgment.  No exam data present No results found. No results found for this or any previous visit (from the past 24 hour(s)).  Assessment/Plan: Katelyn Mccoy is a 37 y.o. female present for OV for  Insomnia, unspecified type discussed many options with her today, including continuing ambien. Discussed control substancese etc  - [facility] database reviewed.  - pt  decided to try trazodone taper.  - traZODone (DESYREL) 50 MG tablet; Take 0.5-1 tablets (25-50 mg total) by mouth at bedtime as needed for sleep.  Dispense: 90 tablet; Refill: 0 - f/u 3 months if doing well, and dose established, then will see every 6 months. Sooner if needed.    *other dx on this encounter self populated and could not be removed secondary to release of labs to be collected that had been ordered prior.   Reviewed expectations re: course of current medical issues.  Discussed self-management of symptoms.  Outlined signs and symptoms indicating need for more acute intervention.  Patient verbalized understanding and all questions were answered.  Patient received an After-Visit Summary.    No orders of the defined types were placed in this encounter.    Note is dictated utilizing voice recognition software. Although note has been proof read prior to signing, occasional typographical errors still can be missed. If any questions arise, please do not hesitate to call for verification.   electronically signed by:  Howard Pouch, DO  [place]

## 2017-08-22 NOTE — Patient Instructions (Addendum)
Trazodone 1/2 tab to 1.5 tabs a night, 1 hour before bed.  Start 1/2 tab for 1 week, if needing can increase by 1/2 tab a week to a total of 1.5 mg a night.    Follow up in 3 months, and whatever dose you landed on that works we will refill for 6 months at a time as lon as working well for you.   If not working follow up sooner.    Insomnia Insomnia is a sleep disorder that makes it difficult to fall asleep or to stay asleep. Insomnia can cause tiredness (fatigue), low energy, difficulty concentrating, mood swings, and poor performance at work or school. There are three different ways to classify insomnia:  Difficulty falling asleep.  Difficulty staying asleep.  Waking up too early in the morning.  Any type of insomnia can be long-term (chronic) or short-term (acute). Both are common. Short-term insomnia usually lasts for three months or less. Chronic insomnia occurs at least three times a week for longer than three months. What are the causes? Insomnia may be caused by another condition, situation, or substance, such as:  Anxiety.  Certain medicines.  Gastroesophageal reflux disease (GERD) or other gastrointestinal conditions.  Asthma or other breathing conditions.  Restless legs syndrome, sleep apnea, or other sleep disorders.  Chronic pain.  Menopause. This may include hot flashes.  Stroke.  Abuse of alcohol, tobacco, or illegal drugs.  Depression.  Caffeine.  Neurological disorders, such as Alzheimer disease.  An overactive thyroid (hyperthyroidism).  The cause of insomnia may not be known. What increases the risk? Risk factors for insomnia include:  Gender. Women are more commonly affected than men.  Age. Insomnia is more common as you get older.  Stress. This may involve your professional or personal life.  Income. Insomnia is more common in people with lower income.  Lack of exercise.  Irregular work schedule or night shifts.  Traveling between  different time zones.  What are the signs or symptoms? If you have insomnia, trouble falling asleep or trouble staying asleep is the main symptom. This may lead to other symptoms, such as:  Feeling fatigued.  Feeling nervous about going to sleep.  Not feeling rested in the morning.  Having trouble concentrating.  Feeling irritable, anxious, or depressed.  How is this treated? Treatment for insomnia depends on the cause. If your insomnia is caused by an underlying condition, treatment will focus on addressing the condition. Treatment may also include:  Medicines to help you sleep.  Counseling or therapy.  Lifestyle adjustments.  Follow these instructions at home:  Take medicines only as directed by your health care provider.  Keep regular sleeping and waking hours. Avoid naps.  Keep a sleep diary to help you and your health care provider figure out what could be causing your insomnia. Include: ? When you sleep. ? When you wake up during the night. ? How well you sleep. ? How rested you feel the next day. ? Any side effects of medicines you are taking. ? What you eat and drink.  Make your bedroom a comfortable place where it is easy to fall asleep: ? Put up shades or special blackout curtains to block light from outside. ? Use a white noise machine to block noise. ? Keep the temperature cool.  Exercise regularly as directed by your health care provider. Avoid exercising right before bedtime.  Use relaxation techniques to manage stress. Ask your health care provider to suggest some techniques that may work well  for you. These may include: ? Breathing exercises. ? Routines to release muscle tension. ? Visualizing peaceful scenes.  Cut back on alcohol, caffeinated beverages, and cigarettes, especially close to bedtime. These can disrupt your sleep.  Do not overeat or eat spicy foods right before bedtime. This can lead to digestive discomfort that can make it hard for  you to sleep.  Limit screen use before bedtime. This includes: ? Watching TV. ? Using your smartphone, tablet, and computer.  Stick to a routine. This can help you fall asleep faster. Try to do a quiet activity, brush your teeth, and go to bed at the same time each night.  Get out of bed if you are still awake after 15 minutes of trying to sleep. Keep the lights down, but try reading or doing a quiet activity. When you feel sleepy, go back to bed.  Make sure that you drive carefully. Avoid driving if you feel very sleepy.  Keep all follow-up appointments as directed by your health care provider. This is important. Contact a health care provider if:  You are tired throughout the day or have trouble in your daily routine due to sleepiness.  You continue to have sleep problems or your sleep problems get worse. Get help right away if:  You have serious thoughts about hurting yourself or someone else. This information is not intended to replace advice given to you by your health care provider. Make sure you discuss any questions you have with your health care provider. Document Released: 03/19/2000 Document Revised: 08/22/2015 Document Reviewed: 12/21/2013 Elsevier Interactive Patient Education  [employer].   Please help [place] help you:  We are honored you have chosen [facility] for your Primary Care home. Below you will find basic instructions that you may need to access in the future. Please help [place] help you by reading the instructions, which cover many of the frequent questions we experience.   Prescription refills and request:  -In order to allow more efficient response time, please call your pharmacy for all refills. They will forward the request electronically to [place]. This allows for the quickest possible response. Request left on a nurse line can take longer to refill, since these are checked as time allows between office patients and other phone calls.  - refill request  can take up to 3-5 working days to complete.  - If request is sent electronically and request is appropiate, it is usually completed in 1-2 business days.  - all patients will need to be seen routinely for all chronic medical conditions requiring prescription medications (see follow-up below). If you are overdue for follow up on your condition, you will be asked to make an appointment and we will call in enough medication to cover you until your appointment (up to 30 days).  - all controlled substances will require a face to face visit to request/refill.  - if you desire your prescriptions to go through a new pharmacy, and have an active script at original pharmacy, you will need to call your pharmacy and have scripts transferred to new pharmacy. This is completed between the pharmacy locations and not by your provider.    Results: If any images or labs were ordered, it can take up to 1 week to get results depending on the test ordered and the lab/facility running and resulting the test. - Normal or stable results, which do not need further discussion, may be released to your mychart immediately with attached note to you.  A call may not be generated for normal results. Please make certain to sign up for mychart. If you have questions on how to activate your mychart you can call the front office.  - If your results need further discussion, our office will attempt to contact you via phone, and if unable to reach you after 2 attempts, we will release your abnormal result to your mychart with instructions.  - All results will be automatically released in mychart after 1 week.  - Your provider will provide you with explanation and instruction on all relevant material in your results. Please keep in mind, results and labs may appear confusing or abnormal to the untrained eye, but it does not mean they are actually abnormal for you personally. If you have any questions about your results that are not covered, or  you desire more detailed explanation than what was provided, you should make an appointment with your provider to do so.   Our office handles many outgoing and incoming calls daily. If we have not contacted you within 1 week about your results, please check your mychart to see if there is a message first and if not, then contact our office.  In helping with this matter, you help decrease call volume, and therefore allow [place] to be able to respond to patients needs more efficiently.   Acute office visits (sick visit):  An acute visit is intended for a new problem and are scheduled in shorter time slots to allow schedule openings for patients with new problems. This is the appropriate visit to discuss a new problem. Problems will not be addressed by phone call or Echart message. Appointment is needed if requesting treatment. In order to provide you with excellent quality medical care with proper time for you to explain your problem, have an exam and receive treatment with instructions, these appointments should be limited to one new problem per visit. If you experience a new problem, in which you desire to be addressed, please make an acute office visit, we save openings on the schedule to accommodate you. Please do not save your new problem for any other type of visit, let us take care of it properly and quickly for you.   Follow up visits:  Depending on your condition(s) your provider will need to see you routinely in order to provide you with quality care and prescribe medication(s). Most chronic conditions (Example: hypertension, Diabetes, depression/anxiety... etc), require visits a couple times a year. Your provider will instruct you on proper follow up for your personal medical conditions and history. Please make certain to make follow up appointments for your condition as instructed. Failing to do so could result in lapse in your medication treatment/refills. If you request a refill, and are overdue to  be seen on a condition, we will always provide you with a 30 day script (once) to allow you time to schedule.    Medicare wellness (well visit): - we have a wonderful Nurse Maudie Mercury), that will meet with you and provide you will yearly medicare wellness visits. These visits should occur yearly (can not be scheduled less than 1 calendar year apart) and cover preventive health, immunizations, advance directives and screenings you are entitled to yearly through your medicare benefits. Do not miss out on your entitled benefits, this is when medicare will pay for these benefits to be ordered for you.  These are strongly encouraged by your provider and is the appropriate type of visit to make certain you are up  to date with all preventive health benefits. If you have not had your medicare wellness exam in the last 12 months, please make certain to schedule one by calling the office and schedule your medicare wellness with Maudie Mercury as soon as possible.   Yearly physical (well visit):  - Adults are recommended to be seen yearly for physicals. Check with your insurance and date of your last physical, most insurances require one calendar year between physicals. Physicals include all preventive health topics, screenings, medical exam and labs that are appropriate for gender/age and history. You may have fasting labs needed at this visit. This is a well visit (not a sick visit), new problems should not be covered during this visit (see acute visit).  - Pediatric patients are seen more frequently when they are younger. Your provider will advise you on well child visit timing that is appropriate for your their age. - This is not a medicare wellness visit. Medicare wellness exams do not have an exam portion to the visit. Some medicare companies allow for a physical, some do not allow a yearly physical. If your medicare allows a yearly physical you can schedule the medicare wellness with our nurse Maudie Mercury and have your physical with  your provider after, on the same day. Please check with insurance for your full benefits.   Late Policy/No Shows:  - all new patients should arrive 15-30 minutes earlier than appointment to allow [place] time  to  obtain all personal demographics,  insurance information and for you to complete office paperwork. - All established patients should arrive 10-15 minutes earlier than appointment time to update all information and be checked in .  - In our best efforts to run on time, if you are late for your appointment you will be asked to either reschedule or if able, we will work you back into the schedule. There will be a wait time to work you back in the schedule,  depending on availability.  - If you are unable to make it to your appointment as scheduled, please call 24 hours ahead of time to allow [place] to fill the time slot with someone else who needs to be seen. If you do not cancel your appointment ahead of time, you may be charged a no show fee.

## 2017-09-07 ENCOUNTER — Telehealth: Payer: Self-pay | Admitting: Family Medicine

## 2017-09-07 ENCOUNTER — Encounter: Payer: Self-pay | Admitting: Family Medicine

## 2017-09-07 MED ORDER — AMITRIPTYLINE HCL 10 MG PO TABS: ORAL_TABLET | ORAL | 0 refills | Status: DC

## 2017-09-07 NOTE — Telephone Encounter (Signed)
Please advise  ----- Message -----  From: Avanell Shackleton Billinger  Sent: 09/07/2017  2:26 PM  To: [facility]  Subject: Non-Urgent Medical Question             ----- Message from [place], Generic sent at 09/07/2017 2:26 PM EDT -----    Good afternoon!     I have been taking the trazodone for a few weeks now and have been experiencing severe headaches about 30-45 minutes after dose. I started out taking every night to see how it would help with sleep. Currently, I have been taking intermittently since experiencing these headaches, I thought initially it was just a fluke, however, it seems whenever I do take it I get a headache and with this happening, it's not helping with the sleep issue.     Could you please advise?     Thank you!        Echart message copied above.   Please call pt:  Headache is a potential SE to this medication. Discontinue if headache is produced with use.  Melatonin can also cause headaches if still using.  Can still try a different  Medication if desired, called amitriptyline. I am will ing to call in med for her with tapering instructions and she would follow up in 3-4 weeks.  If desiring Lorrin Mais, or other sleep aid like lunesta etc, , would need to see to prescribe since it is a controlled substance.

## 2017-09-07 NOTE — Telephone Encounter (Signed)
Spoke with patient she is willing to try the amitriptyline. She will schedule a follow up appt for 3-4 weeks after she starts the medication. She will discontinue the trazodone. She states she hasnt been taking melatonin since starting trazodone

## 2017-09-07 NOTE — Telephone Encounter (Signed)
amitriptyline 10 mg nightly about 1 hour before bed for 7 days, if needed can taper to 20 mg (2 tabs) QHS after day 7. We can continue to taper up on med if needed only after follow up.

## 2017-09-08 NOTE — Telephone Encounter (Signed)
Spoke with patient reviewed information and instructions. Patient verbalized understanding. 

## 2017-09-30 ENCOUNTER — Ambulatory Visit (INDEPENDENT_AMBULATORY_CARE_PROVIDER_SITE_OTHER): Payer: 59 | Admitting: Family Medicine

## 2017-09-30 ENCOUNTER — Encounter: Payer: Self-pay | Admitting: Family Medicine

## 2017-09-30 ENCOUNTER — Telehealth: Payer: Self-pay | Admitting: Family Medicine

## 2017-09-30 VITALS — BP 123/84 | HR 85 | Resp 16 | Ht 63.0 in | Wt 167.0 lb

## 2017-09-30 DIAGNOSIS — G4700 Insomnia, unspecified: Secondary | ICD-10-CM

## 2017-09-30 MED ORDER — HYDROXYZINE HCL 10 MG PO TABS: 10.0000 mg | ORAL_TABLET | Freq: Every day | ORAL | 0 refills | Status: DC

## 2017-09-30 NOTE — Progress Notes (Signed)
Katelyn Mccoy , [birth year], 37 y.o., female MRN: [id number] Patient Care Team    Relationship Specialty Notifications Start End  Ma Hillock, DO PCP - General Family Medicine  08/19/17   Molli Posey, MD Consulting Physician Obstetrics and Gynecology  08/19/17     Chief Complaint  Patient presents with  . Follow-up    insomnia     Subjective:  Pt presents for follow up on her insomnia. She reports the trazodone gave her a headache. She was switched to amitriptyline taper and she states she did well on the 10 mg dose. The 20 mg dose caused her to feel tingly. However, she does not desire a daily med.   Prior note:  Pt presents for an OV with complaints of insomnia of 2-3 year duration.  Associated symptoms include unable to fall asleep and sometimes does not stay asleep.Her OB/GYN had started her ambien at that time. Ambien can give her a sleep hangover feeling and hits her abruptly.  Sleep hygiene: she goes to bed about 10-130 pm every night and wakes at 5-530 am. On the weekends she sleeps until about 7 am. She also uses melatonin nightly.   Depression screen PHQ 2/9 08/19/2017  Decreased Interest 0  Down, Depressed, Hopeless 0  PHQ - 2 Score 0    Allergies  Allergen Reactions  . Sulfa Antibiotics Rash and Hives   Social History   Tobacco Use  . Smoking status: Current Every Day Smoker    Packs/day: 0.50    Years: 5.00    Pack years: 2.50    Types: Cigarettes  . Smokeless tobacco: Never Used  Substance Use Topics  . Alcohol use: Yes    Comment: occasional   Past Medical History:  Diagnosis Date  . Frequent UTI   . GERD (gastroesophageal reflux disease)   . Heart murmur   . History of IBS   . Urinary incontinence    Past Surgical History:  Procedure Laterality Date  . BREAST REDUCTION SURGERY  2001  . CYSTOSCOPY  1980's   multiple.   Marland Kitchen MYRINGOTOMY    . PANNICULECTOMY N/A 12/25/2015   Procedure: ABDOMINAL SCAR REVISION;  Surgeon: Crissie Reese,  MD;  Location: [facility];  Service: Plastics;  Laterality: N/A;   Family History  Problem Relation Age of Onset  . Diabetes Other   . Hyperlipidemia Other   . Hypertension Other    Allergies as of 09/30/2017      Reactions   Sulfa Antibiotics Rash, Hives      Medication List        Accurate as of 09/30/17  2:43 PM. Always use your most recent med list.          amitriptyline 10 MG tablet Commonly known as:  ELAVIL Take 1 tablet (10 mg total) by mouth at bedtime for 7 days, THEN 2 tablets (20 mg total) at bedtime for 23 days. Start taking on:  09/07/2017   fluticasone 50 MCG/ACT nasal spray Commonly known as:  FLONASE Place 2 sprays into both nostrils daily.   omeprazole-sodium bicarbonate 40-1100 MG capsule Commonly known as:  ZEGERID Take 1 capsule by mouth daily before breakfast.       All past medical history, surgical history, allergies, family history, immunizations andmedications were updated in the EMR today and reviewed under the history and medication portions of their EMR.     ROS: Negative, with the exception of above mentioned in HPI   Objective:  BP 123/84 (  BP Location: Left Arm, Patient Position: Sitting, Cuff Size: Normal)   Pulse 85   Resp 16   Ht 5\' 3"  (1.6 m)   Wt 167 lb (75.8 kg)   SpO2 97%   BMI 29.58 kg/m  Body mass index is 29.58 kg/m. Gen: Afebrile. No acute distress. Nontoxic in appearance, well developed, well nourished. Very pleasant female.  HENT: AT. [place].  MMM.  Eyes:Pupils Equal Round Reactive to light, Extraocular movements intact,  Conjunctiva without redness, discharge or icterus. Neuro:  Normal gait. PERLA. EOMi. Alert. Oriented x3C Psych: Normal affect, dress and demeanor. Normal speech. Normal thought content and judgment.  No exam data present No results found. No results found for this or any previous visit (from the past 24 hour(s)).  Assessment/Plan: CHANNAH Mccoy is a 37 y.o. female present for OV for  Insomnia,  unspecified type - trial of vistaril 10-30 mg QD.  - Trazodone--> headache.  - ambien --> sleep hangover and abrupt effect she di dnot desire.  - f/u 6 mos prn   Reviewed expectations re: course of current medical issues.  Discussed self-management of symptoms.  Outlined signs and symptoms indicating need for more acute intervention.  Patient verbalized understanding and all questions were answered.  Patient received an After-Visit Summary.    No orders of the defined types were placed in this encounter.    Note is dictated utilizing voice recognition software. Although note has been proof read prior to signing, occasional typographical errors still can be missed. If any questions arise, please do not hesitate to call for verification.   electronically signed by:  Howard Pouch, DO  [facility]

## 2017-09-30 NOTE — Patient Instructions (Signed)
1-3 capsules before bed as needed. You do not have take this one daily.  Follow up in 6 months if doing well, sooner if needed.    Insomnia Insomnia is a sleep disorder that makes it difficult to fall asleep or to stay asleep. Insomnia can cause tiredness (fatigue), low energy, difficulty concentrating, mood swings, and poor performance at work or school. There are three different ways to classify insomnia:  Difficulty falling asleep.  Difficulty staying asleep.  Waking up too early in the morning.  Any type of insomnia can be long-term (chronic) or short-term (acute). Both are common. Short-term insomnia usually lasts for three months or less. Chronic insomnia occurs at least three times a week for longer than three months. What are the causes? Insomnia may be caused by another condition, situation, or substance, such as:  Anxiety.  Certain medicines.  Gastroesophageal reflux disease (GERD) or other gastrointestinal conditions.  Asthma or other breathing conditions.  Restless legs syndrome, sleep apnea, or other sleep disorders.  Chronic pain.  Menopause. This may include hot flashes.  Stroke.  Abuse of alcohol, tobacco, or illegal drugs.  Depression.  Caffeine.  Neurological disorders, such as Alzheimer disease.  An overactive thyroid (hyperthyroidism).  The cause of insomnia may not be known. What increases the risk? Risk factors for insomnia include:  Gender. Women are more commonly affected than men.  Age. Insomnia is more common as you get older.  Stress. This may involve your professional or personal life.  Income. Insomnia is more common in people with lower income.  Lack of exercise.  Irregular work schedule or night shifts.  Traveling between different time zones.  What are the signs or symptoms? If you have insomnia, trouble falling asleep or trouble staying asleep is the main symptom. This may lead to other symptoms, such as:  Feeling  fatigued.  Feeling nervous about going to sleep.  Not feeling rested in the morning.  Having trouble concentrating.  Feeling irritable, anxious, or depressed.  How is this treated? Treatment for insomnia depends on the cause. If your insomnia is caused by an underlying condition, treatment will focus on addressing the condition. Treatment may also include:  Medicines to help you sleep.  Counseling or therapy.  Lifestyle adjustments.  Follow these instructions at home:  Take medicines only as directed by your health care provider.  Keep regular sleeping and waking hours. Avoid naps.  Keep a sleep diary to help you and your health care provider figure out what could be causing your insomnia. Include: ? When you sleep. ? When you wake up during the night. ? How well you sleep. ? How rested you feel the next day. ? Any side effects of medicines you are taking. ? What you eat and drink.  Make your bedroom a comfortable place where it is easy to fall asleep: ? Put up shades or special blackout curtains to block light from outside. ? Use a white noise machine to block noise. ? Keep the temperature cool.  Exercise regularly as directed by your health care provider. Avoid exercising right before bedtime.  Use relaxation techniques to manage stress. Ask your health care provider to suggest some techniques that may work well for you. These may include: ? Breathing exercises. ? Routines to release muscle tension. ? Visualizing peaceful scenes.  Cut back on alcohol, caffeinated beverages, and cigarettes, especially close to bedtime. These can disrupt your sleep.  Do not overeat or eat spicy foods right before bedtime. This can lead  to digestive discomfort that can make it hard for you to sleep.  Limit screen use before bedtime. This includes: ? Watching TV. ? Using your smartphone, tablet, and computer.  Stick to a routine. This can help you fall asleep faster. Try to do a  quiet activity, brush your teeth, and go to bed at the same time each night.  Get out of bed if you are still awake after 15 minutes of trying to sleep. Keep the lights down, but try reading or doing a quiet activity. When you feel sleepy, go back to bed.  Make sure that you drive carefully. Avoid driving if you feel very sleepy.  Keep all follow-up appointments as directed by your health care provider. This is important. Contact a health care provider if:  You are tired throughout the day or have trouble in your daily routine due to sleepiness.  You continue to have sleep problems or your sleep problems get worse. Get help right away if:  You have serious thoughts about hurting yourself or someone else. This information is not intended to replace advice given to you by your health care provider. Make sure you discuss any questions you have with your health care provider. Document Released: 03/19/2000 Document Revised: 08/22/2015 Document Reviewed: 12/21/2013 Elsevier Interactive Patient Education  [employer].

## 2017-09-30 NOTE — Telephone Encounter (Signed)
Copied from [place] [id number]. Topic: Quick Communication - Rx Refill/Question >> Sep 30, 2017  2:11 PM Lennox Solders wrote: Medication:generic elavil . Pharm is calling.  Preferred Pharmacy (with phone number or street name): [facility] Agent: Please be advised that RX refills may take up to 3 business days. We ask that you follow-up with your pharmacy.

## 2017-09-30 NOTE — Telephone Encounter (Signed)
Call attempted to pharmacy, on hold for prolong time. Will try to call back at later time.

## 2017-10-03 NOTE — Telephone Encounter (Signed)
Pharmacy contacted, questioning refill on Elavil. Last office note stated that patient to take Vistaril for sleep. Elavil was not refilled.

## 2017-11-14 DIAGNOSIS — K219 Gastro-esophageal reflux disease without esophagitis: Secondary | ICD-10-CM | POA: Diagnosis not present

## 2017-11-14 DIAGNOSIS — K625 Hemorrhage of anus and rectum: Secondary | ICD-10-CM | POA: Diagnosis not present

## 2017-11-14 DIAGNOSIS — K580 Irritable bowel syndrome with diarrhea: Secondary | ICD-10-CM | POA: Diagnosis not present

## 2017-11-14 DIAGNOSIS — K5900 Constipation, unspecified: Secondary | ICD-10-CM | POA: Diagnosis not present

## 2017-11-18 ENCOUNTER — Ambulatory Visit: Payer: 59 | Admitting: Family Medicine

## 2017-12-02 DIAGNOSIS — H5213 Myopia, bilateral: Secondary | ICD-10-CM | POA: Diagnosis not present

## 2017-12-02 DIAGNOSIS — H52203 Unspecified astigmatism, bilateral: Secondary | ICD-10-CM | POA: Diagnosis not present

## 2017-12-12 DIAGNOSIS — K219 Gastro-esophageal reflux disease without esophagitis: Secondary | ICD-10-CM | POA: Diagnosis not present

## 2017-12-12 DIAGNOSIS — R197 Diarrhea, unspecified: Secondary | ICD-10-CM | POA: Diagnosis not present

## 2018-01-02 ENCOUNTER — Other Ambulatory Visit: Payer: Self-pay | Admitting: Family Medicine

## 2018-01-02 MED ORDER — HYDROXYZINE HCL 10 MG PO TABS: 10.0000 mg | ORAL_TABLET | Freq: Every day | ORAL | 0 refills | Status: DC

## 2018-01-10 DIAGNOSIS — K625 Hemorrhage of anus and rectum: Secondary | ICD-10-CM | POA: Diagnosis not present

## 2018-01-10 DIAGNOSIS — K219 Gastro-esophageal reflux disease without esophagitis: Secondary | ICD-10-CM | POA: Diagnosis not present

## 2018-01-10 DIAGNOSIS — D123 Benign neoplasm of transverse colon: Secondary | ICD-10-CM | POA: Diagnosis not present

## 2018-01-10 DIAGNOSIS — K921 Melena: Secondary | ICD-10-CM | POA: Diagnosis not present

## 2018-01-10 DIAGNOSIS — K635 Polyp of colon: Secondary | ICD-10-CM | POA: Diagnosis not present

## 2018-01-10 DIAGNOSIS — K52831 Collagenous colitis: Secondary | ICD-10-CM | POA: Diagnosis not present

## 2018-01-10 DIAGNOSIS — R197 Diarrhea, unspecified: Secondary | ICD-10-CM | POA: Diagnosis not present

## 2018-01-16 ENCOUNTER — Other Ambulatory Visit: Payer: Self-pay | Admitting: Podiatry

## 2018-01-16 MED ORDER — METHYLPREDNISOLONE 4 MG PO TBPK: ORAL_TABLET | ORAL | 1 refills | Status: DC

## 2018-01-16 MED ORDER — AZITHROMYCIN 250 MG PO TABS: ORAL_TABLET | ORAL | 1 refills | Status: DC

## 2018-01-24 DIAGNOSIS — Z3009 Encounter for other general counseling and advice on contraception: Secondary | ICD-10-CM | POA: Diagnosis not present

## 2018-01-26 ENCOUNTER — Other Ambulatory Visit: Payer: Self-pay | Admitting: *Deleted

## 2018-01-26 MED ORDER — HYDROXYZINE HCL 10 MG PO TABS: 10.0000 mg | ORAL_TABLET | Freq: Every day | ORAL | 1 refills | Status: DC

## 2018-02-10 DIAGNOSIS — Z30430 Encounter for insertion of intrauterine contraceptive device: Secondary | ICD-10-CM | POA: Diagnosis not present

## 2018-02-10 DIAGNOSIS — Z113 Encounter for screening for infections with a predominantly sexual mode of transmission: Secondary | ICD-10-CM | POA: Diagnosis not present

## 2018-02-10 DIAGNOSIS — Z3202 Encounter for pregnancy test, result negative: Secondary | ICD-10-CM | POA: Diagnosis not present

## 2018-02-27 DIAGNOSIS — R197 Diarrhea, unspecified: Secondary | ICD-10-CM | POA: Diagnosis not present

## 2018-02-27 DIAGNOSIS — K219 Gastro-esophageal reflux disease without esophagitis: Secondary | ICD-10-CM | POA: Diagnosis not present

## 2018-03-06 ENCOUNTER — Telehealth: Payer: 59 | Admitting: Family

## 2018-03-06 DIAGNOSIS — N390 Urinary tract infection, site not specified: Secondary | ICD-10-CM | POA: Diagnosis not present

## 2018-03-06 MED ORDER — CEPHALEXIN 500 MG PO CAPS: 500.0000 mg | ORAL_CAPSULE | Freq: Two times a day (BID) | ORAL | 0 refills | Status: DC

## 2018-03-06 NOTE — Progress Notes (Signed)

## 2018-03-21 DIAGNOSIS — N760 Acute vaginitis: Secondary | ICD-10-CM | POA: Diagnosis not present

## 2018-03-21 DIAGNOSIS — Z30431 Encounter for routine checking of intrauterine contraceptive device: Secondary | ICD-10-CM | POA: Diagnosis not present

## 2018-03-24 MED FILL — MESALAMINE ER 0.375 GM CP24: 0.375 | 30 days supply | Qty: 120 | Fill #0

## 2018-04-03 MED FILL — OMEPRAZOLE 40 MG CPDR: 40 | 30 days supply | Qty: 30 | Fill #0

## 2018-06-14 MED FILL — MESALAMINE ER 0.375 GM CP24: 0.375 | 15 days supply | Qty: 60 | Fill #1

## 2018-07-25 ENCOUNTER — Encounter: Payer: Self-pay | Admitting: Family Medicine

## 2018-07-28 ENCOUNTER — Ambulatory Visit (INDEPENDENT_AMBULATORY_CARE_PROVIDER_SITE_OTHER): Payer: 59 | Admitting: Family Medicine

## 2018-07-28 ENCOUNTER — Other Ambulatory Visit: Payer: Self-pay

## 2018-07-28 ENCOUNTER — Encounter: Payer: Self-pay | Admitting: Family Medicine

## 2018-07-28 VITALS — Temp 98.1°F | Ht 63.0 in

## 2018-07-28 DIAGNOSIS — K219 Gastro-esophageal reflux disease without esophagitis: Secondary | ICD-10-CM | POA: Insufficient documentation

## 2018-07-28 DIAGNOSIS — G4700 Insomnia, unspecified: Secondary | ICD-10-CM | POA: Diagnosis not present

## 2018-07-28 MED ORDER — HYDROXYZINE HCL 25 MG PO TABS: 25.0000 mg | ORAL_TABLET | Freq: Every day | ORAL | 1 refills | Status: DC

## 2018-07-28 NOTE — Progress Notes (Signed)
VIRTUAL VISIT VIA VIDEO  I connected with Katelyn Mccoy on 07/28/18 at  2:20 PM EDT by a video enabled telemedicine application and verified that I am speaking with the correct person using two identifiers. Location patient: Home Location provider: [facility], Office Persons participating in the virtual visit: Patient, Dr. Raoul Pitch and R.Baker, LPN  I discussed the limitations of evaluation and management by telemedicine and the availability of in person appointments. The patient expressed understanding and agreed to proceed.   SUBJECTIVE Chief Complaint  Patient presents with  . Insomnia    Needs refills. No complaints. Pt is wondering if it comes in different MG so she does not have to take 3 tabs nightly     HPI:  Insomnia:  Pt presents for follow-up on her insomnia.  She has been taking 30 mg nightly of Vistaril.  She states some days she does take 30 mg of Vistaril and still feels like she needs a melatonin.  However when she mixes the 2 medications she has a hard time getting motivated in the morning.  Most days she is okay with 30 mg of Vistaril nightly.  She is also wondering if there is an increased dose that she is not taking 3 pills at a time. Prior  meds attempted. She reports the trazodone gave her a headache. She was switched to amitriptyline taper and she states she did well on the 10 mg dose. The 20 mg dose caused her to feel tingly. However, she does not desire a daily med.   Prior note:  Pt presents for an OV with complaints of insomnia of 2-3 year duration.  Associated symptoms include unable to fall asleep and sometimes does not stay asleep.Her OB/GYN had started her ambien at that time. Ambien can give her a sleep hangover feeling and hits her abruptly.  Sleep hygiene: she goes to bed about 10-130 pm every night and wakes at 5-530 am. On the weekends she sleeps until about 7 am. She also uses melatonin nightly.   ROS: See pertinent positives and  negatives per HPI.  Patient Active Problem List   Diagnosis Date Noted  . Insomnia 08/19/2017  . Overweight (BMI 25.0-29.9) 08/19/2017    Social History   Tobacco Use  . Smoking status: Current Every Day Smoker    Packs/day: 0.50    Years: 5.00    Pack years: 2.50    Types: Cigarettes  . Smokeless tobacco: Never Used  Substance Use Topics  . Alcohol use: Yes    Comment: occasional    Current Outpatient Medications:  .  hydrOXYzine (ATARAX/VISTARIL) 10 MG tablet, Take 1-3 tablets (10-30 mg total) by mouth at bedtime., Disp: 90 tablet, Rfl: 1 .  levonorgestrel (MIRENA) 20 MCG/24HR IUD, 1 each by Intrauterine route once., Disp: , Rfl:  .  mesalamine (APRISO) 0.375 g 24 hr capsule, Take 375 mg by mouth 2 (two) times a day., Disp: , Rfl:  .  omeprazole-sodium bicarbonate (ZEGERID) 40-1100 MG per capsule, Take 1 capsule by mouth daily before breakfast., Disp: , Rfl:   Allergies  Allergen Reactions  . Trazodone And Nefazodone Other (See Comments)    headache  . Sulfa Antibiotics Rash and Hives    OBJECTIVE: Temp 98.1 F (36.7 C) (Oral)   Ht 5\' 3"  (1.6 m)   BMI 29.58 kg/m  Gen: No acute distress. Nontoxic in appearance.  HENT: AT. [place].  MMM.  CV: no edema Chest: Cough or shortness of breath not present.  Neuro: Normal gait. Alert. Oriented x3  Psych: Normal affect, dress and demeanor. Normal speech. Normal thought content and judgment.  ASSESSMENT AND PLAN: Katelyn Mccoy is a 38 y.o. female present for  Insomnia, unspecified type - stable.  - Continue vistaril 25 mg- 50 mg QHS.  - Trazodone--> headache.  - ambien --> sleep hangover and abrupt effect she did not desire.  - f/u 6 mos prn  > 15 minutes spent with patient, >50% of time spent face to face    Howard Pouch, DO 07/28/2018

## 2018-07-28 NOTE — Patient Instructions (Signed)
New vistaril dose id 25 mg per tab. Start with 1 tab before bed. If needed, may take two tabs.  F/U 6 mos

## 2018-08-23 ENCOUNTER — Other Ambulatory Visit: Payer: Self-pay | Admitting: Family Medicine

## 2018-09-18 ENCOUNTER — Encounter: Payer: 59 | Admitting: Family Medicine

## 2018-09-29 ENCOUNTER — Ambulatory Visit (INDEPENDENT_AMBULATORY_CARE_PROVIDER_SITE_OTHER): Payer: 59 | Admitting: Family Medicine

## 2018-09-29 ENCOUNTER — Other Ambulatory Visit: Payer: Self-pay

## 2018-09-29 ENCOUNTER — Encounter: Payer: Self-pay | Admitting: Family Medicine

## 2018-09-29 VITALS — BP 131/80 | HR 51 | Temp 98.3°F | Resp 17 | Ht 63.0 in | Wt 154.4 lb

## 2018-09-29 DIAGNOSIS — Z0000 Encounter for general adult medical examination without abnormal findings: Secondary | ICD-10-CM | POA: Diagnosis not present

## 2018-09-29 DIAGNOSIS — Z131 Encounter for screening for diabetes mellitus: Secondary | ICD-10-CM | POA: Diagnosis not present

## 2018-09-29 DIAGNOSIS — E663 Overweight: Secondary | ICD-10-CM

## 2018-09-29 DIAGNOSIS — Z130 Encounter for screening for diseases of the blood and blood-forming organs and certain disorders involving the immune mechanism: Secondary | ICD-10-CM | POA: Diagnosis not present

## 2018-09-29 DIAGNOSIS — Z79899 Other long term (current) drug therapy: Secondary | ICD-10-CM

## 2018-09-29 DIAGNOSIS — G4700 Insomnia, unspecified: Secondary | ICD-10-CM

## 2018-09-29 MED ORDER — HYDROXYZINE HCL 25 MG PO TABS: 25.0000 mg | ORAL_TABLET | Freq: Every day | ORAL | 1 refills | Status: DC

## 2018-09-29 NOTE — Progress Notes (Signed)
Patient ID: Katelyn Mccoy, female  DOB: November 05, 1980, 38 y.o.   MRN: [id number] Patient Care Team    Relationship Specialty Notifications Start End  Ma Hillock, DO PCP - General Family Medicine  08/19/17   Molli Posey, MD Consulting Physician Obstetrics and Gynecology  08/19/17   Carol Ada, MD Consulting Physician Gastroenterology  09/29/18     Chief Complaint  Patient presents with  . Annual Exam    No complaints. Not fasting. Pap smear- 2019     Subjective:  Katelyn Mccoy is a 38 y.o.  Female  present for CPE. All past medical history, surgical history, allergies, family history, immunizations, medications and social history were updated in the electronic medical record today. All recent labs, ED visits and hospitalizations within the last year were reviewed.  Insomnia:  doing well on vistaril - taking PRN.  Health maintenance:  Colonoscopy: no fhx, routine screen Mammogram: No fhx, routine screen. SBE encouraged Cervical cancer screening: last pap: 06/2017, results: normal, completed by: GYN- Dr. Matthew Saras Patient's last menstrual period was 08/18/2017. Immunizations: td- 2019, Influenza 2019(encouraged yearly) Infectious disease screening: HIV completed w/ preg DEXA: routine screen Assistive device: none Oxygen use: none Patient has a Dental home. Hospitalizations/ED visits: reviewed  Depression screen [facility] 2/9 09/29/2018 08/19/2017  Decreased Interest 0 0  Down, Depressed, Hopeless 0 0  PHQ - 2 Score 0 0   No flowsheet data found.   Immunization History  Administered Date(s) Administered  . Influenza-Unspecified 01/05/2017, 01/26/2018  . Td 08/19/2017     Past Medical History:  Diagnosis Date  . Frequent UTI   . GERD (gastroesophageal reflux disease)   . Heart murmur   . History of IBS   . Lymphocytic colitis 2019  . Urinary incontinence    Allergies  Allergen Reactions  . Trazodone And Nefazodone Other (See Comments)    headache  .  Sulfa Antibiotics Rash and Hives   Past Surgical History:  Procedure Laterality Date  . BREAST REDUCTION SURGERY  2001  . CYSTOSCOPY  1980's   multiple.   Marland Kitchen MYRINGOTOMY    . PANNICULECTOMY N/A 12/25/2015   Procedure: ABDOMINAL SCAR REVISION;  Surgeon: Crissie Reese, MD;  Location: [facility];  Service: Plastics;  Laterality: N/A;   Family History  Problem Relation Age of Onset  . Diabetes Other   . Hyperlipidemia Other   . Hypertension Other    Social History   Social History Narrative   Divorced, 1 child.    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 If you have any questions, ask your nurse or doctor.        hydrOXYzine 25 MG tablet Commonly known as: ATARAX/VISTARIL Take 1-2 tablets (25-50 mg total) by mouth at bedtime.   Lactase 9000 units Tabs Take 2 tablets by mouth 2 (two) times a day.   levonorgestrel 20 MCG/24HR IUD Commonly known as: MIRENA 1 each by Intrauterine route once.   mesalamine 0.375 g 24 hr capsule Commonly known as: APRISO Take 375 mg by mouth 2 (two) times a day.   omeprazole-sodium  bicarbonate 40-1100 MG capsule Commonly known as: ZEGERID Take 1 capsule by mouth daily before breakfast.       All past medical history, surgical history, allergies, family history, immunizations andmedications were updated in the EMR today and reviewed under the history and medication portions of their EMR.     No results found for this or any previous visit (from the past 2160 hour(s)).  No results found.   ROS: 14 pt review of systems performed and negative (unless mentioned in an HPI)  Objective: BP 131/80 (BP Location: Left Arm, Patient Position: Sitting, Cuff Size: Normal)    Pulse (!) 51   Temp 98.3 F (36.8 C) (Oral)   Resp 17   Ht '5\' 3"'  (1.6 m)   Wt 154 lb 6 oz (70 kg)   SpO2 99%   BMI 27.35 kg/m  Gen: Afebrile. No acute distress. Nontoxic in appearance, well-developed, well-nourished, overweight, pleasant, Caucasian female HENT: AT. [place]. Bilateral TM visualized and normal in appearance, normal external auditory canal. MMM, no oral lesions, adequate dentition. Bilateral nares within normal limits. Throat without erythema, ulcerations or exudates.  No cough on exam, no hoarseness on exam. Eyes:Pupils Equal Round Reactive to light, Extraocular movements intact,  Conjunctiva without redness, discharge or icterus. Neck/lymp/endocrine: Supple, no lymphadenopathy, ?  Mildly enlarged thyromegaly CV: RRR no murmur, no edema, +2/4 P posterior tibialis pulses.  No carotid bruits. No JVD. Chest: CTAB, no wheeze, rhonchi or crackles.  Normal respiratory effort.  Good air movement. Abd: Soft.  Flat. NTND. BS present.  No masses palpated. No hepatosplenomegaly. No rebound tenderness or guarding. Skin: No rashes, purpura or petechiae. Warm and well-perfused. Skin intact. Neuro/Msk:  Normal gait. PERLA. EOMi. Alert. Oriented x3.  Cranial nerves II through XII intact. Muscle strength 5/5 upper/lower extremity. DTRs equal bilaterally. Psych: Normal affect, dress and demeanor. Normal speech. Normal thought content and judgment.  No exam data present  Assessment/plan: Katelyn Mccoy is a 38 y.o. female present for CPE. Overweight (BMI 25.0-29.9) Diet and exercise modifications - Lipid panel Insomnia, unspecified type Stable.  Refills provided on Vistaril. -If needing additional refills must follow-up in 6 months - TSH-thyroid also felt possibly mildly enlarged on palpation Encounter for long-term current use of medication - Comp Met (CMET) Screening for deficiency anemia - CBC w/Diff Diabetes mellitus screening - Hemoglobin A1c Encounter for preventive health  examination Patient was encouraged to exercise greater than 150 minutes a week. Patient was encouraged to choose a diet filled with fresh fruits and vegetables, and lean meats. AVS provided to patient today for education/recommendation on gender specific health and safety maintenance. Colonoscopy: no fhx, routine screen Mammogram: No fhx, routine screen. SBE encouraged Cervical cancer screening: last pap: 06/2017, results: normal, completed by: GYN- Dr. Matthew Saras Patient's last menstrual period was 08/18/2017. Immunizations: td- 2019, Influenza 2019(encouraged yearly) Infectious disease screening: HIV completed w/ preg DEXA: routine screen  Return in about 1 year (around 09/29/2019) for CPE (30 min).  Electronically signed by: Howard Pouch, DO [place]

## 2018-09-29 NOTE — Patient Instructions (Signed)

## 2018-09-30 LAB — CBC WITH DIFFERENTIAL/PLATELET
Absolute Monocytes: 476 cells/uL (ref 200–950)
Basophils Absolute: 51 cells/uL (ref 0–200)
Basophils Relative: 0.6 %
Eosinophils Absolute: 408 cells/uL (ref 15–500)
Eosinophils Relative: 4.8 %
HCT: 39.7 % (ref 35.0–45.0)
Hemoglobin: 13.5 g/dL (ref 11.7–15.5)
Lymphs Abs: 2695 cells/uL (ref 850–3900)
MCH: 29.7 pg (ref 27.0–33.0)
MCHC: 34 g/dL (ref 32.0–36.0)
MCV: 87.3 fL (ref 80.0–100.0)
MPV: 10.1 fL (ref 7.5–12.5)
Monocytes Relative: 5.6 %
Neutro Abs: 4871 cells/uL (ref 1500–7800)
Neutrophils Relative %: 57.3 %
Platelets: 287 10*3/uL (ref 140–400)
RBC: 4.55 10*6/uL (ref 3.80–5.10)
RDW: 12.6 % (ref 11.0–15.0)
Total Lymphocyte: 31.7 %
WBC: 8.5 10*3/uL (ref 3.8–10.8)

## 2018-09-30 LAB — COMPREHENSIVE METABOLIC PANEL
AG Ratio: 2 (calc) (ref 1.0–2.5)
ALT: 11 U/L (ref 6–29)
AST: 16 U/L (ref 10–30)
Albumin: 4.3 g/dL (ref 3.6–5.1)
Alkaline phosphatase (APISO): 69 U/L (ref 31–125)
BUN: 11 mg/dL (ref 7–25)
CO2: 24 mmol/L (ref 20–32)
Calcium: 9.7 mg/dL (ref 8.6–10.2)
Chloride: 105 mmol/L (ref 98–110)
Creat: 0.68 mg/dL (ref 0.50–1.10)
Globulin: 2.1 g/dL (calc) (ref 1.9–3.7)
Glucose, Bld: 88 mg/dL (ref 65–99)
Potassium: 3.8 mmol/L (ref 3.5–5.3)
Sodium: 139 mmol/L (ref 135–146)
Total Bilirubin: 0.5 mg/dL (ref 0.2–1.2)
Total Protein: 6.4 g/dL (ref 6.1–8.1)

## 2018-09-30 LAB — LIPID PANEL
Cholesterol: 159 mg/dL (ref <200)
HDL: 58 mg/dL (ref >=50)
LDL Cholesterol (Calc): 87 mg/dL (calc)
Non-HDL Cholesterol (Calc): 101 mg/dL (calc) (ref <130)
Total CHOL/HDL Ratio: 2.7 (calc) (ref <5.0)
Triglycerides: 54 mg/dL (ref <150)

## 2018-09-30 LAB — HEMOGLOBIN A1C
Hgb A1c MFr Bld: 5 % of total Hgb (ref <5.7)
Mean Plasma Glucose: 97 (calc)
eAG (mmol/L): 5.4 (calc)

## 2018-09-30 LAB — TSH: TSH: 1.44 mIU/L

## 2018-10-03 ENCOUNTER — Encounter: Payer: Self-pay | Admitting: Family Medicine

## 2018-10-30 ENCOUNTER — Other Ambulatory Visit: Payer: Self-pay

## 2018-10-30 MED ORDER — METHYLPREDNISOLONE 4 MG PO TBPK: ORAL_TABLET | ORAL | 0 refills | Status: DC

## 2018-12-04 ENCOUNTER — Other Ambulatory Visit: Payer: Self-pay | Admitting: Obstetrics and Gynecology

## 2018-12-04 DIAGNOSIS — Z1231 Encounter for screening mammogram for malignant neoplasm of breast: Secondary | ICD-10-CM

## 2018-12-12 DIAGNOSIS — Z01419 Encounter for gynecological examination (general) (routine) without abnormal findings: Secondary | ICD-10-CM | POA: Diagnosis not present

## 2018-12-12 DIAGNOSIS — Z6829 Body mass index (BMI) 29.0-29.9, adult: Secondary | ICD-10-CM | POA: Diagnosis not present

## 2018-12-12 DIAGNOSIS — K52832 Lymphocytic colitis: Secondary | ICD-10-CM | POA: Insufficient documentation

## 2018-12-12 DIAGNOSIS — G4700 Insomnia, unspecified: Secondary | ICD-10-CM | POA: Diagnosis not present

## 2019-01-16 ENCOUNTER — Other Ambulatory Visit: Payer: Self-pay

## 2019-01-16 ENCOUNTER — Ambulatory Visit
Admission: RE | Admit: 2019-01-16 | Discharge: 2019-01-16 | Disposition: A | Discharge status: Home / Self Care | Payer: 59 | Source: Ambulatory Visit | Attending: Obstetrics and Gynecology | Admitting: Obstetrics and Gynecology

## 2019-01-16 DIAGNOSIS — Z1231 Encounter for screening mammogram for malignant neoplasm of breast: Secondary | ICD-10-CM | POA: Diagnosis not present

## 2019-01-25 ENCOUNTER — Other Ambulatory Visit: Payer: 59

## 2019-02-20 DIAGNOSIS — H52203 Unspecified astigmatism, bilateral: Secondary | ICD-10-CM | POA: Diagnosis not present

## 2019-02-20 DIAGNOSIS — H5213 Myopia, bilateral: Secondary | ICD-10-CM | POA: Diagnosis not present

## 2019-03-15 DIAGNOSIS — M79676 Pain in unspecified toe(s): Secondary | ICD-10-CM

## 2019-07-25 ENCOUNTER — Other Ambulatory Visit: Payer: Self-pay

## 2019-07-25 MED ORDER — AZITHROMYCIN 250 MG PO TABS: ORAL_TABLET | ORAL | 1 refills | Status: DC

## 2019-09-17 ENCOUNTER — Other Ambulatory Visit: Payer: Self-pay | Admitting: *Deleted

## 2019-09-17 MED ORDER — HYDROXYZINE HCL 25 MG PO TABS: 25.0000 mg | ORAL_TABLET | Freq: Every day | ORAL | 0 refills | Status: DC

## 2019-09-28 ENCOUNTER — Ambulatory Visit (INDEPENDENT_AMBULATORY_CARE_PROVIDER_SITE_OTHER): Payer: 59 | Admitting: Family Medicine

## 2019-09-28 ENCOUNTER — Other Ambulatory Visit: Payer: Self-pay

## 2019-09-28 ENCOUNTER — Encounter: Payer: Self-pay | Admitting: Family Medicine

## 2019-09-28 VITALS — BP 134/80 | HR 57 | Temp 98.2°F | Resp 18 | Ht 61.75 in | Wt 153.2 lb

## 2019-09-28 DIAGNOSIS — Z13220 Encounter for screening for lipoid disorders: Secondary | ICD-10-CM | POA: Diagnosis not present

## 2019-09-28 DIAGNOSIS — G4700 Insomnia, unspecified: Secondary | ICD-10-CM | POA: Diagnosis not present

## 2019-09-28 DIAGNOSIS — Z975 Presence of (intrauterine) contraceptive device: Secondary | ICD-10-CM

## 2019-09-28 DIAGNOSIS — Z793 Long term (current) use of hormonal contraceptives: Secondary | ICD-10-CM

## 2019-09-28 DIAGNOSIS — Z130 Encounter for screening for diseases of the blood and blood-forming organs and certain disorders involving the immune mechanism: Secondary | ICD-10-CM

## 2019-09-28 DIAGNOSIS — K219 Gastro-esophageal reflux disease without esophagitis: Secondary | ICD-10-CM

## 2019-09-28 DIAGNOSIS — Z131 Encounter for screening for diabetes mellitus: Secondary | ICD-10-CM

## 2019-09-28 DIAGNOSIS — Z0000 Encounter for general adult medical examination without abnormal findings: Secondary | ICD-10-CM

## 2019-09-28 MED ORDER — HYDROXYZINE HCL 25 MG PO TABS: 25.0000 mg | ORAL_TABLET | Freq: Every day | ORAL | 0 refills | Status: DC

## 2019-09-28 MED ORDER — ESOMEPRAZOLE MAGNESIUM 20 MG PO CPDR: 20.0000 mg | DELAYED_RELEASE_CAPSULE | Freq: Two times a day (BID) | ORAL | 5 refills | Status: DC

## 2019-09-28 MED ORDER — HYDROXYZINE HCL 25 MG PO TABS: 50.0000 mg | ORAL_TABLET | Freq: Every day | ORAL | 1 refills | Status: DC

## 2019-09-28 NOTE — Patient Instructions (Signed)
Health Maintenance, Female Adopting a healthy lifestyle and getting preventive care are important in promoting health and wellness. Ask your health care provider about:  The right schedule for you to have regular tests and exams.  Things you can do on your own to prevent diseases and keep yourself healthy. What should I know about diet, weight, and exercise? Eat a healthy diet   Eat a diet that includes plenty of vegetables, fruits, low-fat dairy products, and lean protein.  Do not eat a lot of foods that are high in solid fats, added sugars, or sodium. Maintain a healthy weight Body mass index (BMI) is used to identify weight problems. It estimates body fat based on height and weight. Your health care provider can help determine your BMI and help you achieve or maintain a healthy weight. Get regular exercise Get regular exercise. This is one of the most important things you can do for your health. Most adults should:  Exercise for at least 150 minutes each week. The exercise should increase your heart rate and make you sweat (moderate-intensity exercise).  Do strengthening exercises at least twice a week. This is in addition to the moderate-intensity exercise.  Spend less time sitting. Even light physical activity can be beneficial. Watch cholesterol and blood lipids Have your blood tested for lipids and cholesterol at 41 years of age, then have this test every 5 years. Have your cholesterol levels checked more often if:  Your lipid or cholesterol levels are high.  You are older than 40 years of age.  You are at high risk for heart disease. What should I know about cancer screening? Depending on your health history and family history, you may need to have cancer screening at various ages. This may include screening for:  Breast cancer.  Cervical cancer.  Colorectal cancer.  Skin cancer.  Lung cancer. What should I know about heart disease, diabetes, and high blood  pressure? Blood pressure and heart disease  High blood pressure causes heart disease and increases the risk of stroke. This is more likely to develop in people who have high blood pressure readings, are of African descent, or are overweight.  Have your blood pressure checked: ? Every 3-5 years if you are 18-39 years of age. ? Every year if you are 40 years old or older. Diabetes Have regular diabetes screenings. This checks your fasting blood sugar level. Have the screening done:  Once every three years after age 40 if you are at a normal weight and have a low risk for diabetes.  More often and at a younger age if you are overweight or have a high risk for diabetes. What should I know about preventing infection? Hepatitis B If you have a higher risk for hepatitis B, you should be screened for this virus. Talk with your health care provider to find out if you are at risk for hepatitis B infection. Hepatitis C Testing is recommended for:  Everyone born from 1945 through 1965.  Anyone with known risk factors for hepatitis C. Sexually transmitted infections (STIs)  Get screened for STIs, including gonorrhea and chlamydia, if: ? You are sexually active and are younger than 41 years of age. ? You are older than 41 years of age and your health care provider tells you that you are at risk for this type of infection. ? Your sexual activity has changed since you were last screened, and you are at increased risk for chlamydia or gonorrhea. Ask your health care provider if   you are at risk.  Ask your health care provider about whether you are at high risk for HIV. Your health care provider may recommend a prescription medicine to help prevent HIV infection. If you choose to take medicine to prevent HIV, you should first get tested for HIV. You should then be tested every 3 months for as long as you are taking the medicine. Pregnancy  If you are about to stop having your period (premenopausal) and  you may become pregnant, seek counseling before you get pregnant.  Take 400 to 800 micrograms (mcg) of folic acid every day if you become pregnant.  Ask for birth control (contraception) if you want to prevent pregnancy. Osteoporosis and menopause Osteoporosis is a disease in which the bones lose minerals and strength with aging. This can result in bone fractures. If you are 65 years old or older, or if you are at risk for osteoporosis and fractures, ask your health care provider if you should:  Be screened for bone loss.  Take a calcium or vitamin D supplement to lower your risk of fractures.  Be given hormone replacement therapy (HRT) to treat symptoms of menopause. Follow these instructions at home: Lifestyle  Do not use any products that contain nicotine or tobacco, such as cigarettes, e-cigarettes, and chewing tobacco. If you need help quitting, ask your health care provider.  Do not use street drugs.  Do not share needles.  Ask your health care provider for help if you need support or information about quitting drugs. Alcohol use  Do not drink alcohol if: ? Your health care provider tells you not to drink. ? You are pregnant, may be pregnant, or are planning to become pregnant.  If you drink alcohol: ? Limit how much you use to 0-1 drink a day. ? Limit intake if you are breastfeeding.  Be aware of how much alcohol is in your drink. In the [place] one drink equals one 12 oz bottle of beer (355 mL), one 5 oz glass of wine (148 mL), or one 1 oz glass of hard liquor (44 mL). General instructions  Schedule regular health, dental, and eye exams.  Stay current with your vaccines.  Tell your health care provider if: ? You often feel depressed. ? You have ever been abused or do not feel safe at home. Summary  Adopting a healthy lifestyle and getting preventive care are important in promoting health and wellness.  Follow your health care provider's instructions about healthy  diet, exercising, and getting tested or screened for diseases.  Follow your health care provider's instructions on monitoring your cholesterol and blood pressure. This information is not intended to replace advice given to you by your health care provider. Make sure you discuss any questions you have with your health care provider. Document Revised: 03/15/2018 Document Reviewed: 03/15/2018 Elsevier Patient Education  2020 [employer]  

## 2019-09-28 NOTE — Progress Notes (Signed)
This visit occurred during the SARS-CoV-2 public health emergency.  Safety protocols were in place, including screening questions prior to the visit, additional usage of staff PPE, and extensive cleaning of exam room while observing appropriate contact time as indicated for disinfecting solutions.    Patient ID: Katelyn Mccoy, female  DOB: [birth year], 39 y.o.   MRN: [id number] Patient Care Team    Relationship Specialty Notifications Start End  Ma Hillock, DO PCP - General Family Medicine  08/19/17   Molli Posey, MD Consulting Physician Obstetrics and Gynecology  08/19/17   Carol Ada, MD Consulting Physician Gastroenterology  09/29/18     Chief Complaint  Patient presents with  . Annual Exam    Fasting.     Subjective:  Katelyn Mccoy is a 39 y.o.  Female  present for CPE. All past medical history, surgical history, allergies, family history, immunizations, medications and social history were updated in the electronic medical record today. All recent labs, ED visits and hospitalizations within the last year were reviewed.  Insomnia:  doing ok on vistaril 50 mg QD , but could use more coverage.   Acid reflux: She had been using omeprazole over-the-counter and recently changed to Nexium.  She does feel the Nexium is working much better for her.  It has become expensive OTC.  Health maintenance: Colonoscopy:no fhx, routine screen 20 Mammogram:No fhx, 01/2019 (ordered by gyn at [facility]). SBE encouraged Cervical cancer screening: last pap:06/2017, results:normal, completed by:GYN- Dr. Matthew Saras. No LMP recorded. (Menstrual status: IUD).02/2017 placed Immunizations: td- 2019, Influenza(encouraged yearly), covid series completed Infectious disease screening: HIVcompleted w/ preg DEXA:routine screen Assistive device: none Oxygen WUJ:WJXB Patient has a Dental home. Hospitalizations/ED visits: reviewed  Depression screen [facility] 2/9 09/29/2018 08/19/2017  Decreased  Interest 0 0  Down, Depressed, Hopeless 0 0  PHQ - 2 Score 0 0   No flowsheet data found.   Immunization History  Administered Date(s) Administered  . Influenza-Unspecified 01/05/2017, 01/26/2018, 01/12/2019  . PFIZER SARS-COV-2 Vaccination 04/07/2019, 04/27/2019  . Td 08/19/2017     Past Medical History:  Diagnosis Date  . Frequent UTI   . GERD (gastroesophageal reflux disease)   . Heart murmur   . History of IBS   . Lymphocytic colitis 2019   Dr. Benson Norway  . Urinary incontinence    Allergies  Allergen Reactions  . Trazodone And Nefazodone Other (See Comments)    headache  . Sulfa Antibiotics Rash and Hives   Past Surgical History:  Procedure Laterality Date  . BREAST REDUCTION SURGERY  2001  . CYSTOSCOPY  1980's   multiple.   Marland Kitchen LIPOSUCTION     Laser Liposuction   . MYRINGOTOMY    . PANNICULECTOMY N/A 12/25/2015   Procedure: ABDOMINAL SCAR REVISION;  Surgeon: Crissie Reese, MD;  Location: [facility];  Service: Plastics;  Laterality: N/A;  . REDUCTION MAMMAPLASTY Bilateral    Family History  Problem Relation Age of Onset  . Diabetes Other   . Hyperlipidemia Other   . Hypertension Other    Social History   Social History Narrative   Divorced, 1 child.    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 If you have any questions, ask your nurse or doctor.        STOP taking these medications   azithromycin 250 MG tablet Commonly known as: Zithromax Z-Pak Stopped by: Howard Pouch, DO   mesalamine 0.375 g 24 hr capsule Commonly known as: APRISO Stopped by: Howard Pouch, DO   methylPREDNISolone 4 MG Tbpk tablet Commonly known as: MEDROL DOSEPAK Stopped by: Howard Pouch, DO     omeprazole-sodium bicarbonate 40-1100 MG capsule Commonly known as: ZEGERID Stopped by: Howard Pouch, DO     TAKE these medications   B-12 250 MCG Tabs Take by mouth.   Collagen Hydrolysate Powd by Does not apply route.   esomeprazole 20 MG capsule Commonly known as: NEXIUM Take 1 capsule (20 mg total) by mouth 2 (two) times daily before a meal.   HAIR SKIN AND NAILS FORMULA PO Take by mouth.   hydrOXYzine 25 MG tablet Commonly known as: ATARAX/VISTARIL Take 2-3 tablets (50-75 mg total) by mouth at bedtime. What changed: how much to take Changed by: Howard Pouch, DO   Lactase 9000 units Tabs Take 2 tablets by mouth as needed.   levonorgestrel 20 MCG/24HR IUD Commonly known as: MIRENA 1 each by Intrauterine route once.   Magnesium 500 MG Caps Take by mouth.   Omega 3 1200 MG Caps Take 2 capsules by mouth.   OVER THE COUNTER MEDICATION Black Cohosh 540mg  BID   PROBIOTIC-10 PO Take by mouth.   QC Tumeric Complex 500 MG Caps Generic drug: Turmeric Take by mouth.   VITAMIN D3 PO Take by mouth. 500 mg daily       All past medical history, surgical history, allergies, family history, immunizations andmedications were updated in the EMR today and reviewed under the history and medication portions of their EMR.     No results found for this or any previous visit (from the past 2160 hour(s)).  MM 3D SCREEN BREAST BILATERAL  Result Date: 01/17/2019 CLINICAL DATA:  Screening. EXAM: DIGITAL SCREENING BILATERAL MAMMOGRAM WITH TOMO AND CAD COMPARISON:  Previous exam(s). ACR Breast Density Category b: There are scattered areas of fibroglandular density. FINDINGS: There are no findings suspicious for malignancy. Images were processed with CAD. IMPRESSION: No mammographic evidence of malignancy. A result letter of this screening mammogram will be mailed directly to the patient. RECOMMENDATION: Screening mammogram in one year. (Code:SM-B-01Y) BI-RADS CATEGORY  1: Negative.  Electronically Signed   By: Curlene Dolphin M.D.   On: 01/17/2019 17:14     ROS: 14 pt review of systems performed and negative (unless mentioned in an HPI)  Objective: BP 134/80 (BP Location: Right Arm, Patient Position: Sitting, Cuff Size: Normal)   Pulse (!) 57   Temp 98.2 F (36.8 C) (Temporal)   Resp 18   Ht 5' 1.75" (1.568 m)   Wt 153 lb 4 oz (69.5 kg)   SpO2 99%   BMI 28.26 kg/m  Gen: Afebrile. No acute distress. Nontoxic in appearance, well-developed, well-nourished, pleasant female. HENT: AT. [place]. Bilateral TM visualized and normal in appearance, normal external auditory canal. MMM, no oral lesions, adequate dentition. Bilateral nares within normal limits. Throat without erythema, ulcerations or exudates.  No cough on exam, no hoarseness on exam. Eyes:Pupils Equal Round Reactive to light, Extraocular movements intact,  Conjunctiva without redness, discharge or icterus. Neck/lymp/endocrine: Supple, no lymphadenopathy, no thyromegaly CV: RRR no murmur, no edema, +2/4 P posterior tibialis pulses.  Chest: CTAB, no wheeze, rhonchi  or crackles.  Normal respiratory effort.  Good air movement. Abd: Soft.  Flat. NTND. BS present.  No masses palpated. No hepatosplenomegaly. No rebound tenderness or guarding. Skin: No rashes, purpura or petechiae. Warm and well-perfused. Skin intact. Neuro/Msk:  Normal gait. PERLA. EOMi. Alert. Oriented x3.  Cranial nerves II through XII intact. Muscle strength 5/5 upper/lower extremity. DTRs equal bilaterally. Psych: Normal affect, dress and demeanor. Normal speech. Normal thought content and judgment.  No exam data present  Assessment/plan: Katelyn Mccoy is a 39 y.o. female present for CPE Screening for deficiency anemia - CBC Diabetes mellitus screening - Comprehensive metabolic panel - Hemoglobin A1c Lipid screening/IUD (intrauterine device) in place/Long term use of hormonal contraception - Lipid panel Insomnia, unspecified type Increase  Vistaril to 50-75 mg nightly. - TSH Follow-up every 6 months Acid reflux: Been taking Zegerid OTC which was not working as well for her.  Now she is taking Nexium OTC.  SHe would like to try to get this in prescription format to help save money. If Nexium not covered could try Protonix.  She was encouraged not to take higher dose than what is prescribed.  If needing additional coverage would encourage her to try Pepcid OTC as well. Prescribed Nexium Encounter for preventative health examination: Patient was encouraged to exercise greater than 150 minutes a week. Patient was encouraged to choose a diet filled with fresh fruits and vegetables, and lean meats. AVS provided to patient today for education/recommendation on gender specific health and safety maintenance. Colonoscopy:no fhx, routine screen 63 Mammogram:No fhx, 01/2019 (ordered by gyn at [facility]). SBE encouraged Cervical cancer screening: last pap:06/2017, results:normal, completed by:GYN- Dr. Matthew Saras. No LMP recorded. (Menstrual status: IUD).02/2017 placed Immunizations: td- 2019, Influenza(encouraged yearly), covid series completed Infectious disease screening: HIVcompleted w/ preg DEXA:routine screen  Return in about 1 year (around 09/27/2020) for CPE (30 min).  6 months on chronic medical conditions  Orders Placed This Encounter  Procedures  . CBC  . Comprehensive metabolic panel  . Hemoglobin A1c  . Lipid panel  . TSH   Meds ordered this encounter  Medications  . DISCONTD: hydrOXYzine (ATARAX/VISTARIL) 25 MG tablet    Sig: Take 1-2 tablets (25-50 mg total) by mouth at bedtime.    Dispense:  135 tablet    Refill:  0  . hydrOXYzine (ATARAX/VISTARIL) 25 MG tablet    Sig: Take 2-3 tablets (50-75 mg total) by mouth at bedtime.    Dispense:  270 tablet    Refill:  1    DC prior script please.  . esomeprazole (NEXIUM) 20 MG capsule    Sig: Take 1 capsule (20 mg total) by mouth 2 (two) times daily before a meal.     Dispense:  60 capsule    Refill:  5   Referral Orders  No referral(s) requested today     Electronically signed by: Howard Pouch, [place]

## 2019-09-29 LAB — CBC
HCT: 40.1 % (ref 35.0–45.0)
Hemoglobin: 13.7 g/dL (ref 11.7–15.5)
MCH: 30.9 pg (ref 27.0–33.0)
MCHC: 34.2 g/dL (ref 32.0–36.0)
MCV: 90.5 fL (ref 80.0–100.0)
MPV: 9.8 fL (ref 7.5–12.5)
Platelets: 270 10*3/uL (ref 140–400)
RBC: 4.43 10*6/uL (ref 3.80–5.10)
RDW: 12.1 % (ref 11.0–15.0)
WBC: 9.1 10*3/uL (ref 3.8–10.8)

## 2019-09-29 LAB — COMPREHENSIVE METABOLIC PANEL
AG Ratio: 2.3 (calc) (ref 1.0–2.5)
ALT: 11 U/L (ref 6–29)
AST: 15 U/L (ref 10–30)
Albumin: 4.4 g/dL (ref 3.6–5.1)
Alkaline phosphatase (APISO): 69 U/L (ref 31–125)
BUN: 15 mg/dL (ref 7–25)
CO2: 26 mmol/L (ref 20–32)
Calcium: 9.7 mg/dL (ref 8.6–10.2)
Chloride: 104 mmol/L (ref 98–110)
Creat: 0.78 mg/dL (ref 0.50–1.10)
Globulin: 1.9 g/dL (calc) (ref 1.9–3.7)
Glucose, Bld: 101 mg/dL — ABNORMAL HIGH (ref 65–99)
Potassium: 3.9 mmol/L (ref 3.5–5.3)
Sodium: 140 mmol/L (ref 135–146)
Total Bilirubin: 0.6 mg/dL (ref 0.2–1.2)
Total Protein: 6.3 g/dL (ref 6.1–8.1)

## 2019-09-29 LAB — LIPID PANEL
Cholesterol: 154 mg/dL (ref <200)
HDL: 64 mg/dL (ref >=50)
LDL Cholesterol (Calc): 76 mg/dL (calc)
Non-HDL Cholesterol (Calc): 90 mg/dL (calc) (ref <130)
Total CHOL/HDL Ratio: 2.4 (calc) (ref <5.0)
Triglycerides: 60 mg/dL (ref <150)

## 2019-09-29 LAB — TSH: TSH: 0.95 mIU/L

## 2019-09-29 LAB — HEMOGLOBIN A1C
Hgb A1c MFr Bld: 4.7 % of total Hgb (ref <5.7)
Mean Plasma Glucose: 88 (calc)
eAG (mmol/L): 4.9 (calc)

## 2019-10-20 ENCOUNTER — Other Ambulatory Visit: Payer: Self-pay | Admitting: Podiatry

## 2019-10-20 MED ORDER — ITRACONAZOLE 100 MG PO CAPS: 100.0000 mg | ORAL_CAPSULE | Freq: Two times a day (BID) | ORAL | 1 refills | Status: DC

## 2019-11-23 ENCOUNTER — Other Ambulatory Visit: Payer: Self-pay

## 2019-11-23 MED ORDER — AZITHROMYCIN 250 MG PO TABS
ORAL_TABLET | ORAL | 0 refills | Status: DC
Start: 2019-11-23 — End: 2019-12-06

## 2019-11-23 NOTE — Progress Notes (Signed)
Per Dr. Amalia Hailey, rx for Z-pack has been sent to patient's pharmacy

## 2019-12-06 ENCOUNTER — Other Ambulatory Visit: Payer: Self-pay | Admitting: Podiatry

## 2019-12-06 MED ORDER — AZITHROMYCIN 250 MG PO TABS: ORAL_TABLET | ORAL | 1 refills | Status: DC

## 2019-12-06 MED ORDER — METHYLPREDNISOLONE 4 MG PO TBPK: ORAL_TABLET | ORAL | 1 refills | Status: DC

## 2020-01-03 ENCOUNTER — Other Ambulatory Visit: Payer: Self-pay | Admitting: Obstetrics and Gynecology

## 2020-01-03 DIAGNOSIS — Z1231 Encounter for screening mammogram for malignant neoplasm of breast: Secondary | ICD-10-CM

## 2020-01-18 ENCOUNTER — Other Ambulatory Visit: Payer: Self-pay

## 2020-01-18 ENCOUNTER — Ambulatory Visit
Admission: RE | Admit: 2020-01-18 | Discharge: 2020-01-18 | Disposition: A | Discharge status: Home / Self Care | Payer: 59 | Source: Ambulatory Visit

## 2020-01-18 DIAGNOSIS — Z1231 Encounter for screening mammogram for malignant neoplasm of breast: Secondary | ICD-10-CM

## 2020-02-13 DIAGNOSIS — Z1329 Encounter for screening for other suspected endocrine disorder: Secondary | ICD-10-CM | POA: Diagnosis not present

## 2020-02-13 DIAGNOSIS — Z13228 Encounter for screening for other metabolic disorders: Secondary | ICD-10-CM | POA: Diagnosis not present

## 2020-02-13 DIAGNOSIS — Z13220 Encounter for screening for lipoid disorders: Secondary | ICD-10-CM | POA: Diagnosis not present

## 2020-02-13 DIAGNOSIS — Z113 Encounter for screening for infections with a predominantly sexual mode of transmission: Secondary | ICD-10-CM | POA: Diagnosis not present

## 2020-02-13 DIAGNOSIS — Z1321 Encounter for screening for nutritional disorder: Secondary | ICD-10-CM | POA: Diagnosis not present

## 2020-02-13 DIAGNOSIS — Z01419 Encounter for gynecological examination (general) (routine) without abnormal findings: Secondary | ICD-10-CM | POA: Diagnosis not present

## 2020-02-13 DIAGNOSIS — Z6827 Body mass index (BMI) 27.0-27.9, adult: Secondary | ICD-10-CM | POA: Diagnosis not present

## 2020-02-13 LAB — HM PAP SMEAR: HM Pap smear: NORMAL

## 2020-02-14 LAB — TSH: TSH: 0.89 (ref 0.41–5.90)

## 2020-02-14 LAB — LIPID PANEL
Cholesterol: 143 (ref 0–200)
HDL: 57 (ref 35–70)
LDL Cholesterol: 70
LDl/HDL Ratio: 1.2
Triglycerides: 84 (ref 40–160)

## 2020-02-14 LAB — BASIC METABOLIC PANEL
BUN: 11 (ref 4–21)
CO2: 26 — AB (ref 13–22)
Chloride: 103 (ref 99–108)
Creatinine: 0.9 (ref 0.5–1.1)
Glucose: 99
Potassium: 4 (ref 3.4–5.3)
Sodium: 140 (ref 137–147)

## 2020-02-14 LAB — COMPREHENSIVE METABOLIC PANEL
Albumin: 4.4 (ref 3.5–5.0)
Calcium: 9.3 (ref 8.7–10.7)
GFR calc Af Amer: 98
GFR calc non Af Amer: 85
Globulin: 1.9

## 2020-02-14 LAB — HEPATIC FUNCTION PANEL
ALT: 14 (ref 7–35)
AST: 14 (ref 13–35)
Alkaline Phosphatase: 76 (ref 25–125)
Bilirubin, Total: 0.2

## 2020-02-14 LAB — VITAMIN D 25 HYDROXY (VIT D DEFICIENCY, FRACTURES): Vit D, 25-Hydroxy: 35.7

## 2020-03-05 ENCOUNTER — Other Ambulatory Visit: Payer: Self-pay

## 2020-03-05 MED ORDER — METHYLPREDNISOLONE 4 MG PO TABS: ORAL_TABLET | ORAL | 0 refills | Status: DC

## 2020-03-26 ENCOUNTER — Other Ambulatory Visit: Payer: Self-pay

## 2020-03-26 MED ORDER — HYDROXYZINE HCL 25 MG PO TABS
50.0000 mg | ORAL_TABLET | Freq: Every day | ORAL | 0 refills | Status: DC
Start: 2020-03-26 — End: 2020-09-30

## 2020-04-05 DIAGNOSIS — U071 COVID-19: Secondary | ICD-10-CM

## 2020-04-05 HISTORY — DX: COVID-19: U07.1

## 2020-09-30 ENCOUNTER — Other Ambulatory Visit: Payer: Self-pay

## 2020-09-30 ENCOUNTER — Ambulatory Visit (INDEPENDENT_AMBULATORY_CARE_PROVIDER_SITE_OTHER): Payer: 59 | Admitting: Family Medicine

## 2020-09-30 ENCOUNTER — Encounter: Payer: Self-pay | Admitting: Family Medicine

## 2020-09-30 ENCOUNTER — Other Ambulatory Visit: Payer: Self-pay | Admitting: Family Medicine

## 2020-09-30 VITALS — BP 115/76 | HR 66 | Temp 98.2°F | Resp 16 | Ht 62.0 in | Wt 154.6 lb

## 2020-09-30 DIAGNOSIS — Z0001 Encounter for general adult medical examination with abnormal findings: Secondary | ICD-10-CM

## 2020-09-30 DIAGNOSIS — Z130 Encounter for screening for diseases of the blood and blood-forming organs and certain disorders involving the immune mechanism: Secondary | ICD-10-CM | POA: Diagnosis not present

## 2020-09-30 DIAGNOSIS — E663 Overweight: Secondary | ICD-10-CM | POA: Diagnosis not present

## 2020-09-30 DIAGNOSIS — K219 Gastro-esophageal reflux disease without esophagitis: Secondary | ICD-10-CM

## 2020-09-30 DIAGNOSIS — B370 Candidal stomatitis: Secondary | ICD-10-CM | POA: Diagnosis not present

## 2020-09-30 DIAGNOSIS — F5101 Primary insomnia: Secondary | ICD-10-CM

## 2020-09-30 DIAGNOSIS — Z1159 Encounter for screening for other viral diseases: Secondary | ICD-10-CM

## 2020-09-30 DIAGNOSIS — Z975 Presence of (intrauterine) contraceptive device: Secondary | ICD-10-CM

## 2020-09-30 DIAGNOSIS — Z131 Encounter for screening for diabetes mellitus: Secondary | ICD-10-CM

## 2020-09-30 MED ORDER — HYDROXYZINE HCL 50 MG PO TABS: 50.0000 mg | ORAL_TABLET | Freq: Every day | ORAL | 1 refills | Status: DC

## 2020-09-30 MED ORDER — NYSTATIN 100000 UNIT/ML MT SUSP: 5.0000 mL | Freq: Four times a day (QID) | OROMUCOSAL | 0 refills | Status: DC

## 2020-09-30 MED ORDER — ESOMEPRAZOLE MAGNESIUM 20 MG PO CPDR: 20.0000 mg | DELAYED_RELEASE_CAPSULE | Freq: Two times a day (BID) | ORAL | 1 refills | Status: DC

## 2020-09-30 NOTE — Patient Instructions (Signed)
Oral Thrush, Adult Oral thrush is an infection in your mouth and throat and on your tongue. Itcauses white patches to form in your mouth and on your tongue. Many cases of thrush are mild. But, sometimes, thrush can be serious. People who have a weak body defense system (immune system) or other diseases can be affected more. What are the causes? This condition is caused by a type of fungus called yeast. The fungus is normally present in small amounts in the mouth and nose. If a person has a long-term illness or a weak body defense system, the fungus can grow and spreadquickly. This causes thrush. What increases the risk? You are more likely to develop this condition if: You have a weak body defense system. You are an older adult. You have diabetes, cancer, or HIV. You have a dry mouth. You are pregnant or breastfeeding. You do not take good care of your teeth. This risk is greater for people who have false teeth (dentures). You use antibiotic or steroid medicines. What are the signs or symptoms? Symptoms of this condition include: A burning feeling in the mouth and throat. White patches that stick to the mouth and tongue. A bad taste in the mouth or trouble tasting foods. A feeling like you have cotton in your mouth. Pain when you eat and swallow. Not wanting to eat as much as usual. Cracking at the corners of the mouth. How is this treated? This condition is treated with medicines called antifungals. These medicines prevent a fungus from growing. The medicines are either put right on the area (topical) or swallowed (oral). Your doctor will also treat other problems that you may have, such as diabetesor HIV. Follow these instructions at home: Medicines Take or use over-the-counter and prescription medicines only as told by your doctor. Ask your doctor about an over-the-counter medicine called gentian violet. Helping with pain and soreness To lessen your pain: Drink cold liquids, like  water and iced tea. Eat frozen ice pops or frozen juices. Eat foods that are easy to swallow, like gelatin and ice cream. Drink from a straw if you have too much pain in your mouth.  General instructions Eat plain yogurt that has live cultures in it. Read the label to make sure that there are live cultures in your yogurt. If you wear false teeth: Take them out before you go to bed. Brush them well. Soak them in a cleaner. Rinse your mouth with warm salt-water many times a day. To make the salt-water mixture, dissolve -1 teaspoon (3-6 g) of salt in 1 cup (237 mL) of warm water. Contact a doctor if: Your problems do not get better within 7 days of treatment. Your infection is spreading. This may show as white areas on the skin outside of your mouth. You are nursing your baby and you have redness and pain in the nipples. Summary Oral thrush is an infection in your mouth and throat. It is caused by a fungus. You are more likely to get this condition if you have a weak body defense system. Diseases like diabetes, cancer, or HIV also add to your risk. This condition is treated with medicines called antifungals. Contact a doctor if you do not get better within 7 days of starting treatment. This information is not intended to replace advice given to you by your health care provider. Make sure you discuss any questions you have with your healthcare provider. Document Revised: 01/26/2019 Document Reviewed: 01/26/2019 Elsevier Patient Education  [employer].  Health Maintenance, Female Adopting a healthy lifestyle and getting preventive care are important in promoting health and wellness. Ask your health care provider about: The right schedule for you to have regular tests and exams. Things you can do on your own to prevent diseases and keep yourself healthy. What should I know about diet, weight, and exercise? Eat a healthy diet  Eat a diet that includes plenty of vegetables, fruits,  low-fat dairy products, and lean protein. Do not eat a lot of foods that are high in solid fats, added sugars, or sodium.  Maintain a healthy weight Body mass index (BMI) is used to identify weight problems. It estimates body fat based on height and weight. Your health care provider can help determineyour BMI and help you achieve or maintain a healthy weight. Get regular exercise Get regular exercise. This is one of the most important things you can do for your health. Most adults should: Exercise for at least 150 minutes each week. The exercise should increase your heart rate and make you sweat (moderate-intensity exercise). Do strengthening exercises at least twice a week. This is in addition to the moderate-intensity exercise. Spend less time sitting. Even light physical activity can be beneficial. Watch cholesterol and blood lipids Have your blood tested for lipids and cholesterol at 42 years of age, then havethis test every 5 years. Have your cholesterol levels checked more often if: Your lipid or cholesterol levels are high. You are older than 42 years of age. You are at high risk for heart disease. What should I know about cancer screening? Depending on your health history and family history, you may need to have cancer screening at various ages. This may include screening for: Breast cancer. Cervical cancer. Colorectal cancer. Skin cancer. Lung cancer. What should I know about heart disease, diabetes, and high blood pressure? Blood pressure and heart disease High blood pressure causes heart disease and increases the risk of stroke. This is more likely to develop in people who have high blood pressure readings, are of African descent, or are overweight. Have your blood pressure checked: Every 3-5 years if you are 35-18 years of age. Every year if you are 80 years old or older. Diabetes Have regular diabetes screenings. This checks your fasting blood sugar level. Have the  screening done: Once every three years after age 81 if you are at a normal weight and have a low risk for diabetes. More often and at a younger age if you are overweight or have a high risk for diabetes. What should I know about preventing infection? Hepatitis B If you have a higher risk for hepatitis B, you should be screened for this virus. Talk with your health care provider to find out if you are at risk forhepatitis B infection. Hepatitis C Testing is recommended for: Everyone born from [birth year] through 1967. Anyone with known risk factors for hepatitis C. Sexually transmitted infections (STIs) Get screened for STIs, including gonorrhea and chlamydia, if: You are sexually active and are younger than 42 years of age. You are older than 42 years of age and your health care provider tells you that you are at risk for this type of infection. Your sexual activity has changed since you were last screened, and you are at increased risk for chlamydia or gonorrhea. Ask your health care provider if you are at risk. Ask your health care provider about whether you are at high risk for HIV. Your health care provider may recommend a prescription medicine to  help prevent HIV infection. If you choose to take medicine to prevent HIV, you should first get tested for HIV. You should then be tested every 3 months for as long as you are taking the medicine. Pregnancy If you are about to stop having your period (premenopausal) and you may become pregnant, seek counseling before you get pregnant. Take 400 to 800 micrograms (mcg) of folic acid every day if you become pregnant. Ask for birth control (contraception) if you want to prevent pregnancy. Osteoporosis and menopause Osteoporosis is a disease in which the bones lose minerals and strength with aging. This can result in bone fractures. If you are 40 years old or older, or if you are at risk for osteoporosis and fractures, ask your health care provider if you  should: Be screened for bone loss. Take a calcium or vitamin D supplement to lower your risk of fractures. Be given hormone replacement therapy (HRT) to treat symptoms of menopause. Follow these instructions at home: Lifestyle Do not use any products that contain nicotine or tobacco, such as cigarettes, e-cigarettes, and chewing tobacco. If you need help quitting, ask your health care provider. Do not use street drugs. Do not share needles. Ask your health care provider for help if you need support or information about quitting drugs. Alcohol use Do not drink alcohol if: Your health care provider tells you not to drink. You are pregnant, may be pregnant, or are planning to become pregnant. If you drink alcohol: Limit how much you use to 0-1 drink a day. Limit intake if you are breastfeeding. Be aware of how much alcohol is in your drink. In the [place] one drink equals one 12 oz bottle of beer (355 mL), one 5 oz glass of wine (148 mL), or one 1 oz glass of hard liquor (44 mL). General instructions Schedule regular health, dental, and eye exams. Stay current with your vaccines. Tell your health care provider if: You often feel depressed. You have ever been abused or do not feel safe at home. Summary Adopting a healthy lifestyle and getting preventive care are important in promoting health and wellness. Follow your health care provider's instructions about healthy diet, exercising, and getting tested or screened for diseases. Follow your health care provider's instructions on monitoring your cholesterol and blood pressure. This information is not intended to replace advice given to you by your health care provider. Make sure you discuss any questions you have with your healthcare provider. Document Revised: 03/15/2018 Document Reviewed: 03/15/2018 Elsevier Patient Education  2022 [employer].

## 2020-09-30 NOTE — Progress Notes (Signed)
This visit occurred during the SARS-CoV-2 public health emergency.  Safety protocols were in place, including screening questions prior to the visit, additional usage of staff PPE, and extensive cleaning of exam room while observing appropriate contact time as indicated for disinfecting solutions.    Patient ID: Katelyn Mccoy, female  DOB: October 05, 1980, 40 y.o.   MRN: [id number] Patient Care Team    Relationship Specialty Notifications Start End  Ma Hillock, DO PCP - General Family Medicine  08/19/17   Molli Posey, MD Consulting Physician Obstetrics and Gynecology  08/19/17   Carol Ada, MD Consulting Physician Gastroenterology  09/29/18     Chief Complaint  Patient presents with   Annual Exam    Pt is fasting. Last mammogram 10/21 and pap 11/21. GYN is Dr.Holland    Subjective:  Katelyn Mccoy is a 40 y.o.  Female  present for CPE/cmc/acute. All past medical history, surgical history, allergies, family history, immunizations, medications and social history were updated in the electronic medical record today. All recent labs, ED visits and hospitalizations within the last year were reviewed.   Insomnia: Patient reports she is doing well on Vistaril and she is taking 75 mg nightly.   Acid reflux: Patient reports she has continue Nexium 20 mg twice daily.  She reports even at this dose it does not completely control her symptoms.  She has not tried to add Pepcid to her regimen.  Mouth discomfort: Patient reports new symptom of mouth burning and discomfort.  She states she has noticed it on the inside of her mouth on both sides for the last couple weeks.  She made a dental visit appointment, which is scheduled for the end of this week.   Health maintenance:  Colonoscopy: no fhx, routine screen 43 Mammogram: No fhx, UTD 2021 (ordered by gyn at [facility]). SBE encouraged Cervical cancer screening: last pap: 2021, results: normal, completed by: GYN- Dr. Matthew Saras. No LMP recorded.  (Menstrual status: IUD).02/2017 placed Immunizations: td- 2019, Influenza UTD 2021 (encouraged yearly), covid series completed and booster Infectious disease screening: HIV completed w/ preg; hep c screen today DEXA: routine screen Assistive device: none Oxygen ERD:EYCX Patient has a Dental home. Hospitalizations/ED visits: reviewed   Depression screen [facility] 2/9 09/30/2020 09/29/2018 08/19/2017  Decreased Interest 0 0 0  Down, Depressed, Hopeless 0 0 0  PHQ - 2 Score 0 0 0   No flowsheet data found.  Immunization History  Administered Date(s) Administered   Influenza-Unspecified 01/05/2017, 01/26/2018, 01/12/2019, 01/04/2020   PFIZER(Purple Top)SARS-COV-2 Vaccination 04/07/2019, 04/27/2019, 03/05/2020   Td 08/19/2017   Past Medical History:  Diagnosis Date   COVID-19 04/2020   Frequent UTI    GERD (gastroesophageal reflux disease)    Heart murmur    History of IBS    Lymphocytic colitis 2019   Dr. Benson [place]   Urinary incontinence    Allergies  Allergen Reactions   Trazodone And Nefazodone Other (See Comments)    Severe Headache   Sulfa Antibiotics Rash and Hives   Past Surgical History:  Procedure Laterality Date   BREAST REDUCTION SURGERY  2001   CYSTOSCOPY  1980's   multiple.    LIPOSUCTION     Laser Liposuction    MYRINGOTOMY     PANNICULECTOMY N/A 12/25/2015   Procedure: ABDOMINAL SCAR REVISION;  Surgeon: Crissie Reese, MD;  Location: [place];  Service: Plastics;  Laterality: N/A;   REDUCTION MAMMAPLASTY Bilateral    Family History  Problem Relation Age of Onset  Diabetes Other    Hyperlipidemia Other    Hypertension Other    Social History   Social History Narrative   Divorced, 1 child.    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 If you have any questions, ask your nurse or doctor.          STOP taking these medications    azithromycin 250 MG tablet Commonly known as: Zithromax Z-Pak Stopped by: Howard Pouch, DO   itraconazole 100 MG capsule Commonly known as: Sporanox Stopped by: Howard Pouch, DO   methylPREDNISolone 4 MG tablet Commonly known as: MEDROL Stopped by: Howard Pouch, DO   methylPREDNISolone 4 MG Tbpk tablet Commonly known as: MEDROL DOSEPAK Stopped by: Howard Pouch, DO       TAKE these medications    B-12 250 MCG Tabs Take by mouth.   Collagen Hydrolysate Powd by Does not apply route.   esomeprazole 20 MG capsule Commonly known as: NEXIUM Take 1 capsule (20 mg total) by mouth 2 (two) times daily before a meal.   HAIR SKIN AND NAILS FORMULA PO Take by mouth.   hydrOXYzine 50 MG tablet Commonly known as: ATARAX/VISTARIL Take 1-1.5 tablets (50-75 mg total) by mouth at bedtime. What changed: medication strength Changed by: Howard Pouch, DO   Lactase 9000 units Tabs Take 2 tablets by mouth as needed.   levonorgestrel 20 MCG/24HR IUD Commonly known as: MIRENA 1 each by Intrauterine route once.   Magnesium 500 MG Caps Take by mouth.   nystatin 100000 UNIT/ML suspension Commonly known as: MYCOSTATIN Take 5 mLs (500,000 Units total) by mouth 4 (four) times daily. 5 ml swish for 30 seconds and then spit out, QID, do not swallow. Started by: Howard Pouch, DO   Omega 3 1200 MG Caps Take 2 capsules by mouth.   OVER THE COUNTER MEDICATION Black Cohosh 540mg  BID   PROBIOTIC-10 PO Take by mouth.   Turmeric 500 MG Caps Take by mouth.   VITAMIN D3 PO Take by mouth. 500 mg daily        All past medical history, surgical history, allergies, family history, immunizations andmedications were updated in the EMR today and reviewed under the history and medication portions of their EMR.     No results  found for this or any previous visit (from the past 2160 hour(s)).  MM 3D SCREEN BREAST BILATERAL  Result Date: 01/23/2020 CLINICAL DATA:  Screening. EXAM: DIGITAL SCREENING BILATERAL MAMMOGRAM WITH TOMO AND CAD COMPARISON:  Previous exam(s). ACR Breast Density Category b: There are scattered areas of fibroglandular density. FINDINGS: There are no findings suspicious for malignancy. Images were processed with CAD. IMPRESSION: No mammographic evidence of malignancy. A result letter of this screening mammogram will be mailed directly to the patient. RECOMMENDATION: Screening mammogram in one year. (Code:SM-B-01Y) BI-RADS CATEGORY  1: Negative. Electronically Signed   By: Audie Pinto M.D.   On: 01/23/2020 07:49   ROS: 14 pt review of systems performed and negative (unless mentioned in an HPI)  Objective: BP 115/76   Pulse 66  Temp 98.2 F (36.8 C) (Oral)   Resp 16   Ht 5\' 2"  (1.575 m)   Wt 154 lb 9.6 oz (70.1 kg)   SpO2 99%   BMI 28.28 kg/m  Gen: Afebrile. No acute distress. Nontoxic in appearance, well-developed, well-nourished, pleasant, mildly overweight female. HENT: AT. [place]. Bilateral TM visualized and normal in appearance, normal external auditory canal-very mild effusion left tympanic membrane. MMM, bilateral oral/buccal white plaque-like lesions present , adequate dentition. Bilateral nares within normal limits. Throat without erythema, ulcerations or exudates.  No cough on exam, no hoarseness on exam. Eyes:Pupils Equal Round Reactive to light, Extraocular movements intact,  Conjunctiva without redness, discharge or icterus. Neck/lymp/endocrine: Supple, no lymphadenopathy, no thyromegaly CV: RRR no murmur, no edema, +2/4 P posterior tibialis pulses.  Chest: CTAB, no wheeze, rhonchi or crackles.  Normal respiratory effort.  Good air movement. Abd: Soft.  Flat. NTND. BS present.  No masses palpated. No hepatosplenomegaly. No rebound tenderness or guarding. Skin: No rashes, purpura  or petechiae. Warm and well-perfused. Skin intact. Neuro/Msk:  Normal gait. PERLA. EOMi. Alert. Oriented x3.  Cranial nerves II through XII intact. Muscle strength 5/5 upper/lower extremity. DTRs equal bilaterally. Psych: Normal affect, dress and demeanor. Normal speech. Normal thought content and judgment.   No results found.  Assessment/plan: Katelyn Mccoy is a 40 y.o. female present for CPE/CMC/acute Primary insomnia Stable. Continue Vistaril 75 mg nightly - TSH Follow-up 5.5 months Overweight (BMI 25.0-29.9) Routine exercise and dietary modifications encouraged - Lipid panel IUD (intrauterine device) in place - Comprehensive metabolic panel  Gastroesophageal reflux disease without esophagitis Could use better control. Continue Nexium 20 mg twice daily. Start OTC Pepcid 20 mg twice daily - Comprehensive metabolic panel Diabetes mellitus screening - Hemoglobin A1c Screening for deficiency anemia - CBC with Differential/Platelet Need for hepatitis C screening test - Hepatitis C Antibody Thrush: New problem Nystatin swish 4 times daily x5 days. Patient was encouraged to replace toothbrush Encounter for annual health exam with abnormal findings: Patient was encouraged to exercise greater than 150 minutes a week. Patient was encouraged to choose a diet filled with fresh fruits and vegetables, and lean meats. AVS provided to patient today for education/recommendation on gender specific health and safety maintenance. Colonoscopy: no fhx, routine screen 43 Mammogram: No fhx, UTD 2021 (ordered by gyn at [facility]). SBE encouraged Cervical cancer screening: last pap: 2021, results: normal, completed by: GYN- Dr. Matthew Saras. No LMP recorded. (Menstrual status: IUD).02/2017 placed Immunizations: td- 2019, Influenza UTD 2021 (encouraged yearly), covid series completed and booster Infectious disease screening: HIV completed w/ preg; hep c screen today  Return in about 5 months (around  03/11/2021) for [place] (30 min).   Orders Placed This Encounter  Procedures   CBC with Differential/Platelet   Hemoglobin A1c   Lipid panel   Comprehensive metabolic panel   TSH   Hepatitis C Antibody   Meds ordered this encounter  Medications   esomeprazole (NEXIUM) 20 MG capsule    Sig: Take 1 capsule (20 mg total) by mouth 2 (two) times daily before a meal.    Dispense:  180 capsule    Refill:  1   hydrOXYzine (ATARAX/VISTARIL) 50 MG tablet    Sig: Take 1-1.5 tablets (50-75 mg total) by mouth at bedtime.    Dispense:  135 tablet    Refill:  1   nystatin (MYCOSTATIN) 100000 UNIT/ML suspension    Sig: Take 5 mLs (500,000 Units total) by mouth 4 (four) times daily. 5 ml swish for  30 seconds and then spit out, QID, do not swallow.    Dispense:  100 mL    Refill:  0   Referral Orders  No referral(s) requested today     Electronically signed by: Howard Pouch, [place]

## 2020-10-01 LAB — CBC WITH DIFFERENTIAL/PLATELET
Absolute Monocytes: 524 cells/uL (ref 200–950)
Basophils Absolute: 49 cells/uL (ref 0–200)
Basophils Relative: 0.5 %
Eosinophils Absolute: 485 cells/uL (ref 15–500)
Eosinophils Relative: 5 %
HCT: 41.3 % (ref 35.0–45.0)
Hemoglobin: 14.2 g/dL (ref 11.7–15.5)
Lymphs Abs: 3085 cells/uL (ref 850–3900)
MCH: 30.9 pg (ref 27.0–33.0)
MCHC: 34.4 g/dL (ref 32.0–36.0)
MCV: 89.8 fL (ref 80.0–100.0)
MPV: 9.4 fL (ref 7.5–12.5)
Monocytes Relative: 5.4 %
Neutro Abs: 5558 cells/uL (ref 1500–7800)
Neutrophils Relative %: 57.3 %
Platelets: 278 10*3/uL (ref 140–400)
RBC: 4.6 10*6/uL (ref 3.80–5.10)
RDW: 12.5 % (ref 11.0–15.0)
Total Lymphocyte: 31.8 %
WBC: 9.7 10*3/uL (ref 3.8–10.8)

## 2020-10-01 LAB — HEPATITIS C ANTIBODY
Hepatitis C Ab: NONREACTIVE
SIGNAL TO CUT-OFF: 0.01 (ref <1.00)

## 2020-10-01 LAB — HEMOGLOBIN A1C
Hgb A1c MFr Bld: 4.9 % of total Hgb (ref <5.7)
Mean Plasma Glucose: 94 mg/dL
eAG (mmol/L): 5.2 mmol/L

## 2020-10-01 LAB — LIPID PANEL
Cholesterol: 166 mg/dL (ref <200)
HDL: 65 mg/dL (ref >=50)
LDL Cholesterol (Calc): 84 mg/dL (calc)
Non-HDL Cholesterol (Calc): 101 mg/dL (calc) (ref <130)
Total CHOL/HDL Ratio: 2.6 (calc) (ref <5.0)
Triglycerides: 76 mg/dL (ref <150)

## 2020-10-01 LAB — COMPREHENSIVE METABOLIC PANEL
AG Ratio: 1.9 (calc) (ref 1.0–2.5)
ALT: 13 U/L (ref 6–29)
AST: 17 U/L (ref 10–30)
Albumin: 4.2 g/dL (ref 3.6–5.1)
Alkaline phosphatase (APISO): 67 U/L (ref 31–125)
BUN: 18 mg/dL (ref 7–25)
CO2: 20 mmol/L (ref 20–32)
Calcium: 9.6 mg/dL (ref 8.6–10.2)
Chloride: 104 mmol/L (ref 98–110)
Creat: 0.9 mg/dL (ref 0.50–1.10)
Globulin: 2.2 g/dL (calc) (ref 1.9–3.7)
Glucose, Bld: 76 mg/dL (ref 65–99)
Potassium: 4 mmol/L (ref 3.5–5.3)
Sodium: 139 mmol/L (ref 135–146)
Total Bilirubin: 0.5 mg/dL (ref 0.2–1.2)
Total Protein: 6.4 g/dL (ref 6.1–8.1)

## 2020-10-01 LAB — TSH: TSH: 1.17 mIU/L

## 2020-10-01 NOTE — Telephone Encounter (Signed)
Please advise 

## 2020-10-02 ENCOUNTER — Telehealth: Payer: Self-pay | Admitting: Family Medicine

## 2020-10-02 NOTE — Progress Notes (Signed)
LM for pt to return call to discuss.  

## 2020-10-02 NOTE — Telephone Encounter (Signed)
LVM for pt to CB regarding results.  

## 2020-10-02 NOTE — Telephone Encounter (Signed)
Pt is wanting a call back concerning her most recent lab results. Please advise pt

## 2020-10-02 NOTE — Telephone Encounter (Signed)
LVM for pt to CB regarding medication.  ?

## 2020-10-02 NOTE — Telephone Encounter (Signed)
Please let pt know insurance did not cover 20 mg BID- So I called in nexium 40 mg - so take once daily- same dose.

## 2020-10-15 IMAGING — MG DIGITAL SCREENING BILAT W/ TOMO W/ CAD
8 series · 9 of 24 positions shown · non-contrast
Comparison: Previous exam(s).

CLINICAL DATA: Screening.

EXAM:
DIGITAL SCREENING BILATERAL MAMMOGRAM WITH TOMO AND CAD

[R CC synth-2D]
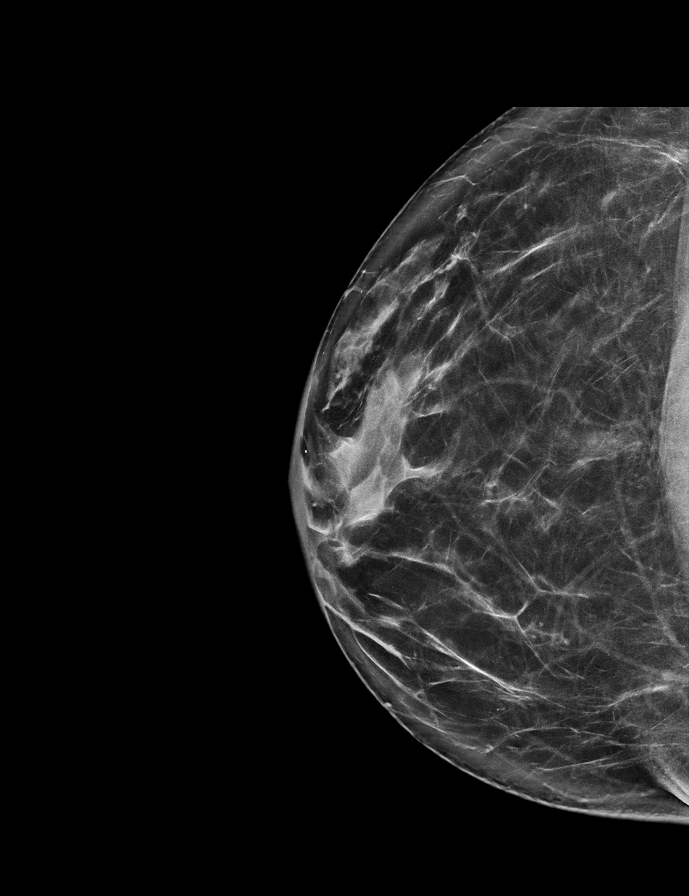

[R MLO synth-2D]
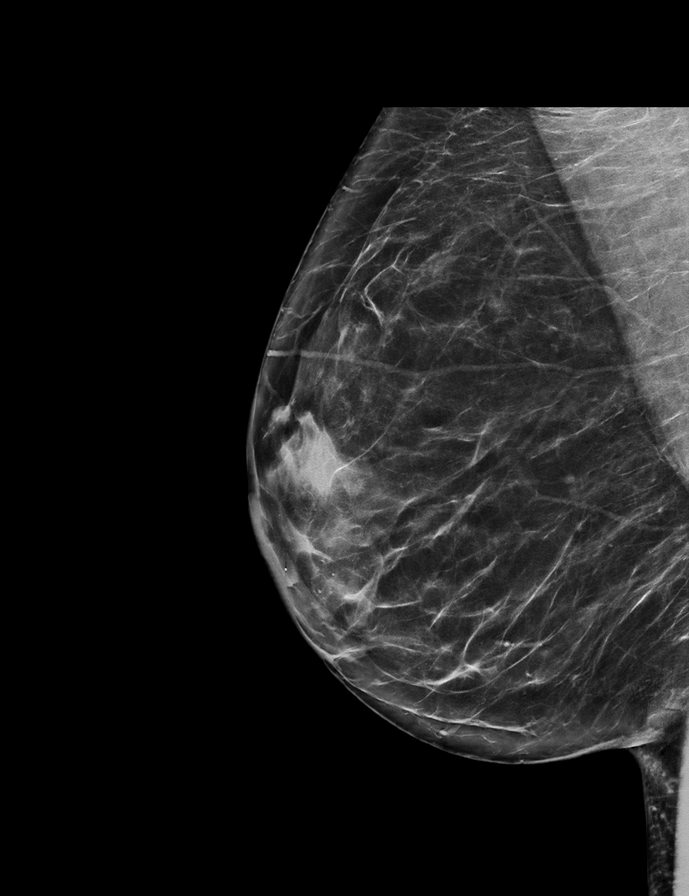

[L MLO synth-2D]
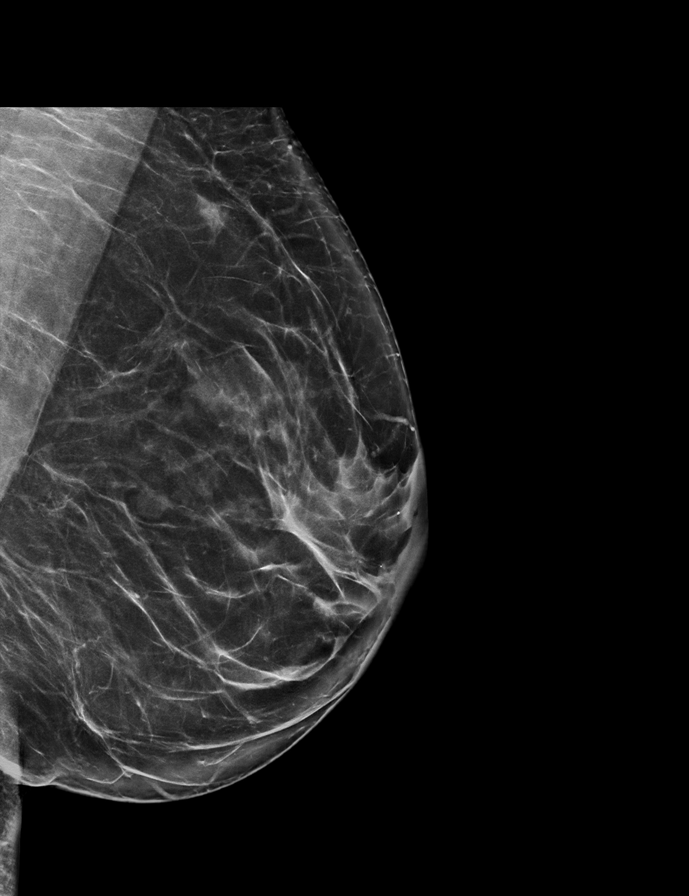

[L CC synth-2D]
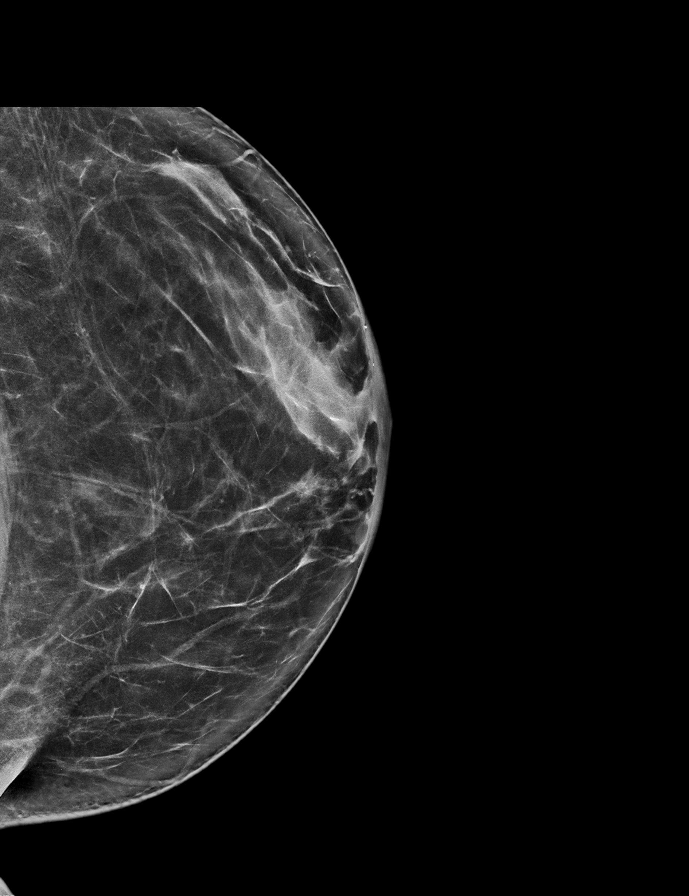

[R CC tomo · 2 of 80 frames shown]
[frame 26/80]
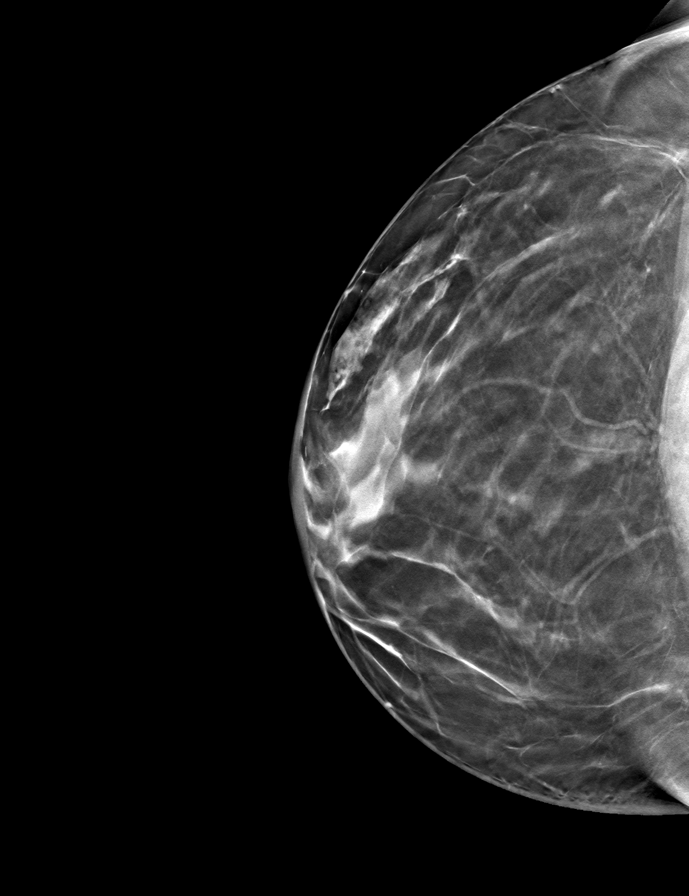
[frame 41/80]
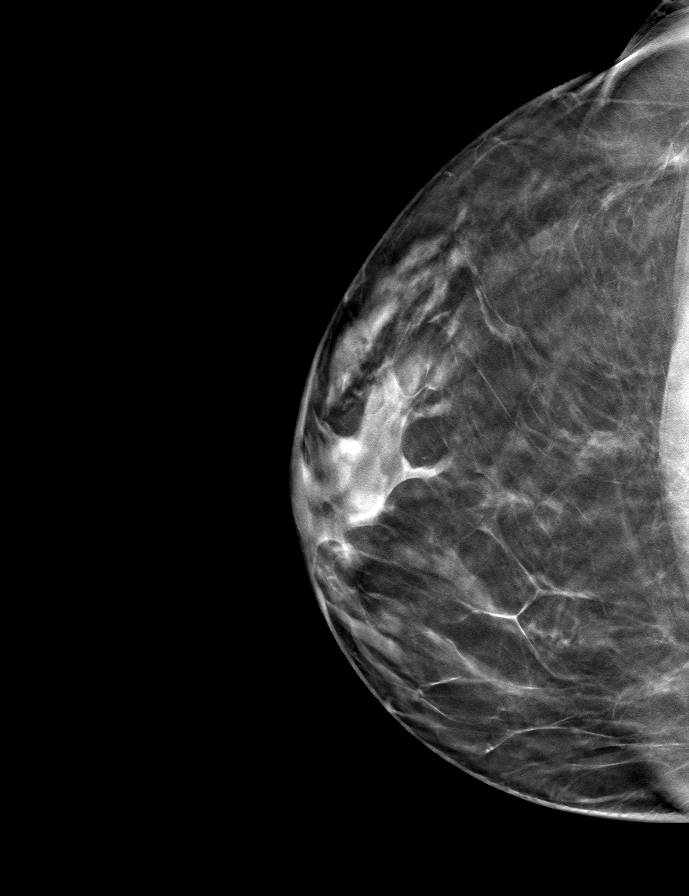

[R MLO tomo · tomo slice 41/80.0]
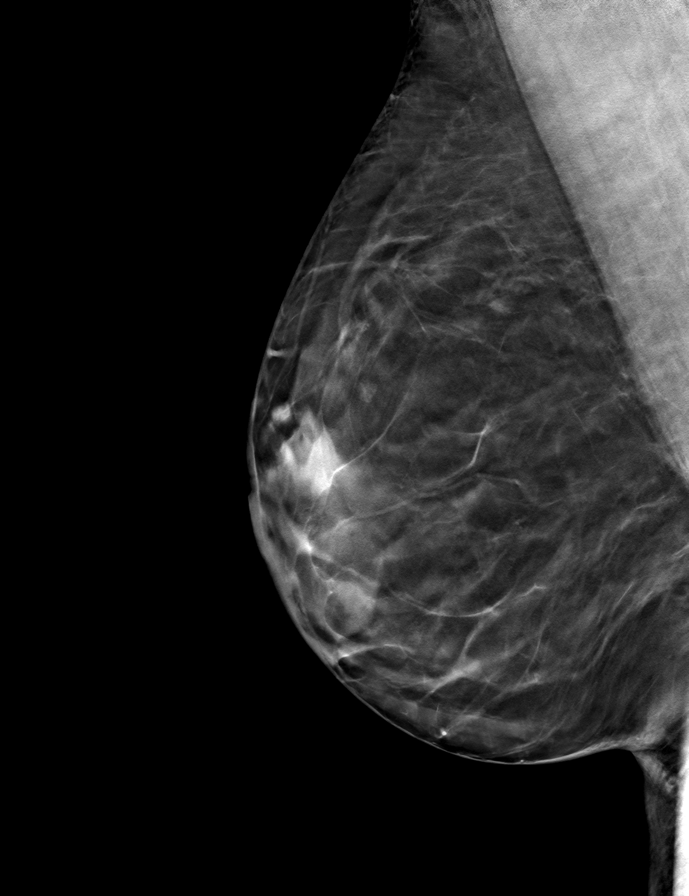

[L CC tomo · tomo slice 40/79.0]
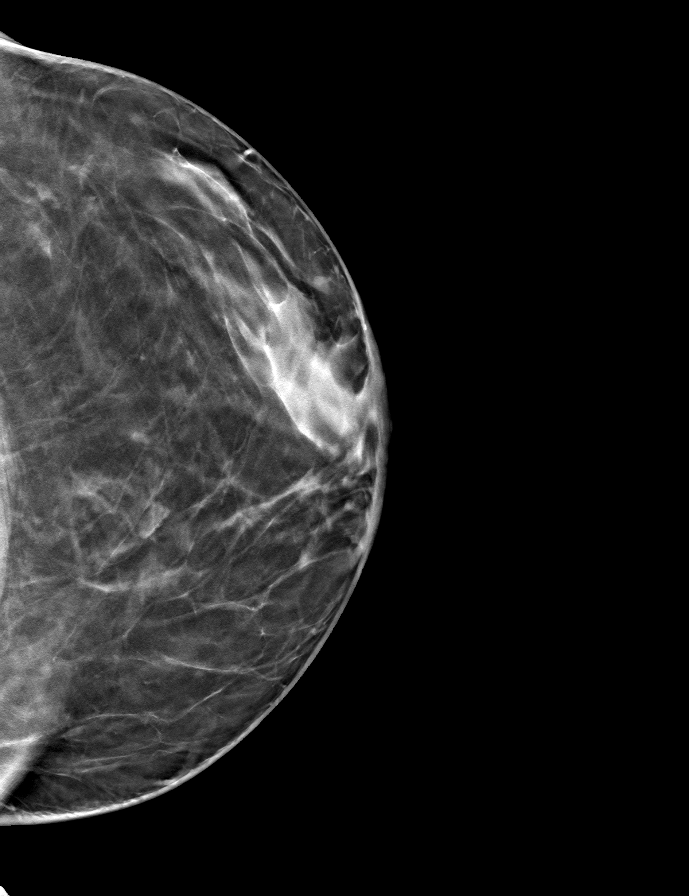

[L MLO tomo · tomo slice 42/83.0]
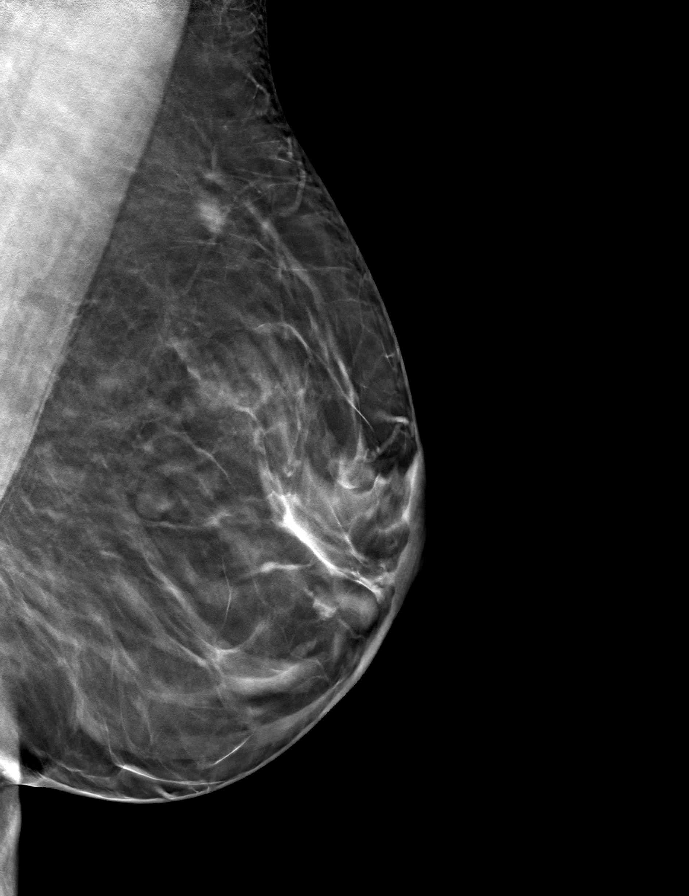

[9 of 24 positions shown; findings below may reference images not displayed]

ACR Breast Density Category b: There are scattered areas of
fibroglandular density.
FINDINGS: There are no findings suspicious for malignancy. Images were
processed with CAD.
IMPRESSION: No mammographic evidence of malignancy. A result letter of this
screening mammogram will be mailed directly to the patient.

RECOMMENDATION:
Screening mammogram in one year. (Code:CN-U-775)

BI-RADS CATEGORY  1: Negative.

## 2020-12-05 ENCOUNTER — Other Ambulatory Visit: Payer: Self-pay | Admitting: Obstetrics and Gynecology

## 2020-12-05 DIAGNOSIS — Z1231 Encounter for screening mammogram for malignant neoplasm of breast: Secondary | ICD-10-CM

## 2020-12-23 DIAGNOSIS — R8271 Bacteriuria: Secondary | ICD-10-CM | POA: Diagnosis not present

## 2020-12-23 DIAGNOSIS — R350 Frequency of micturition: Secondary | ICD-10-CM | POA: Diagnosis not present

## 2020-12-23 DIAGNOSIS — N3946 Mixed incontinence: Secondary | ICD-10-CM | POA: Diagnosis not present

## 2020-12-23 DIAGNOSIS — N3944 Nocturnal enuresis: Secondary | ICD-10-CM | POA: Diagnosis not present

## 2021-01-09 DIAGNOSIS — N3946 Mixed incontinence: Secondary | ICD-10-CM | POA: Diagnosis not present

## 2021-01-20 ENCOUNTER — Other Ambulatory Visit: Payer: Self-pay

## 2021-01-20 ENCOUNTER — Ambulatory Visit
Admission: RE | Admit: 2021-01-20 | Discharge: 2021-01-20 | Disposition: A | Discharge status: Home / Self Care | Payer: 59 | Source: Ambulatory Visit

## 2021-01-20 DIAGNOSIS — Z1231 Encounter for screening mammogram for malignant neoplasm of breast: Secondary | ICD-10-CM

## 2021-01-22 DIAGNOSIS — N3946 Mixed incontinence: Secondary | ICD-10-CM | POA: Diagnosis not present

## 2021-01-22 DIAGNOSIS — N8111 Cystocele, midline: Secondary | ICD-10-CM | POA: Diagnosis not present

## 2021-02-19 DIAGNOSIS — Z113 Encounter for screening for infections with a predominantly sexual mode of transmission: Secondary | ICD-10-CM | POA: Diagnosis not present

## 2021-02-19 DIAGNOSIS — Z01419 Encounter for gynecological examination (general) (routine) without abnormal findings: Secondary | ICD-10-CM | POA: Diagnosis not present

## 2021-02-19 DIAGNOSIS — Z6830 Body mass index (BMI) 30.0-30.9, adult: Secondary | ICD-10-CM | POA: Diagnosis not present

## 2021-02-19 LAB — CBC AND DIFFERENTIAL: Hemoglobin: 14.2 (ref 12.0–16.0)

## 2021-03-06 ENCOUNTER — Ambulatory Visit: Payer: 59 | Admitting: Family Medicine

## 2021-03-06 DIAGNOSIS — R350 Frequency of micturition: Secondary | ICD-10-CM | POA: Diagnosis not present

## 2021-03-06 DIAGNOSIS — R8271 Bacteriuria: Secondary | ICD-10-CM | POA: Diagnosis not present

## 2021-03-06 DIAGNOSIS — N3946 Mixed incontinence: Secondary | ICD-10-CM | POA: Diagnosis not present

## 2021-03-20 ENCOUNTER — Other Ambulatory Visit: Payer: Self-pay | Admitting: Podiatry

## 2021-03-20 MED ORDER — AZITHROMYCIN 250 MG PO TABS: ORAL_TABLET | ORAL | 2 refills | Status: AC

## 2021-06-08 ENCOUNTER — Other Ambulatory Visit: Payer: Self-pay | Admitting: Podiatry

## 2021-06-08 MED ORDER — FLUCONAZOLE 150 MG PO TABS: 150.0000 mg | ORAL_TABLET | Freq: Once | ORAL | 2 refills | Status: AC

## 2021-06-08 MED ORDER — AZITHROMYCIN 250 MG PO TABS: ORAL_TABLET | ORAL | 2 refills | Status: AC

## 2021-07-07 ENCOUNTER — Other Ambulatory Visit: Payer: Self-pay | Admitting: Family Medicine

## 2021-08-04 ENCOUNTER — Other Ambulatory Visit: Payer: Self-pay | Admitting: Family Medicine

## 2021-11-11 ENCOUNTER — Encounter: Payer: 59 | Admitting: Family Medicine

## 2021-11-13 ENCOUNTER — Encounter: Payer: Self-pay | Admitting: Family Medicine

## 2021-11-13 ENCOUNTER — Ambulatory Visit (INDEPENDENT_AMBULATORY_CARE_PROVIDER_SITE_OTHER): Payer: 59 | Admitting: Family Medicine

## 2021-11-13 VITALS — BP 126/83 | HR 92 | Temp 98.2°F | Ht 62.0 in | Wt 156.0 lb

## 2021-11-13 DIAGNOSIS — Z1231 Encounter for screening mammogram for malignant neoplasm of breast: Secondary | ICD-10-CM | POA: Diagnosis not present

## 2021-11-13 DIAGNOSIS — Z79899 Other long term (current) drug therapy: Secondary | ICD-10-CM

## 2021-11-13 DIAGNOSIS — Z0000 Encounter for general adult medical examination without abnormal findings: Secondary | ICD-10-CM | POA: Diagnosis not present

## 2021-11-13 DIAGNOSIS — K219 Gastro-esophageal reflux disease without esophagitis: Secondary | ICD-10-CM

## 2021-11-13 LAB — COMPREHENSIVE METABOLIC PANEL
ALT: 12 U/L (ref 0–35)
AST: 14 U/L (ref 0–37)
Albumin: 4.5 g/dL (ref 3.5–5.2)
Alkaline Phosphatase: 69 U/L (ref 39–117)
BUN: 14 mg/dL (ref 6–23)
CO2: 27 mEq/L (ref 19–32)
Calcium: 9.5 mg/dL (ref 8.4–10.5)
Chloride: 103 mEq/L (ref 96–112)
Creatinine, Ser: 0.81 mg/dL (ref 0.40–1.20)
GFR: 89.27 mL/min (ref >60.00)
Glucose, Bld: 88 mg/dL (ref 70–99)
Potassium: 3.8 mEq/L (ref 3.5–5.1)
Sodium: 136 mEq/L (ref 135–145)
Total Bilirubin: 0.5 mg/dL (ref 0.2–1.2)
Total Protein: 6.6 g/dL (ref 6.0–8.3)

## 2021-11-13 LAB — LIPID PANEL
Cholesterol: 170 mg/dL (ref 0–200)
HDL: 60.2 mg/dL (ref >39.00)
LDL Cholesterol: 96 mg/dL (ref 0–99)
NonHDL: 109.81
Total CHOL/HDL Ratio: 3
Triglycerides: 71 mg/dL (ref 0.0–149.0)
VLDL: 14.2 mg/dL (ref 0.0–40.0)

## 2021-11-13 LAB — CBC
HCT: 40.6 % (ref 36.0–46.0)
Hemoglobin: 13.8 g/dL (ref 12.0–15.0)
MCHC: 34 g/dL (ref 30.0–36.0)
MCV: 91.3 fl (ref 78.0–100.0)
Platelets: 260 10*3/uL (ref 150.0–400.0)
RBC: 4.45 Mil/uL (ref 3.87–5.11)
RDW: 13.6 % (ref 11.5–15.5)
WBC: 8.3 10*3/uL (ref 4.0–10.5)

## 2021-11-13 LAB — HEMOGLOBIN A1C: Hgb A1c MFr Bld: 5.2 % (ref 4.6–6.5)

## 2021-11-13 LAB — TSH: TSH: 1.39 u[IU]/mL (ref 0.35–5.50)

## 2021-11-13 NOTE — Progress Notes (Signed)
Patient ID: Katelyn Mccoy, female  DOB: 07-May-1980, 41 y.o.   MRN: [id number] Patient Care Team    Relationship Specialty Notifications Start End  Ma Hillock, DO PCP - General Family Medicine  08/19/17   Molli Posey, MD Consulting Physician Obstetrics and Gynecology  08/19/17   Carol Ada, MD Consulting Physician Gastroenterology  09/29/18     Chief Complaint  Patient presents with   Annual Exam    Pt is not fasting;     Subjective: Katelyn Mccoy is a 41 y.o.  Female  present for CPE All past medical history, surgical history, allergies, family history, immunizations, medications and social history were updated in the electronic medical record today. All recent labs, ED visits and hospitalizations within the last year were reviewed.  Health maintenance:  Colonoscopy: no fhx, routine screen 43 Mammogram: No fhx, 01/2021 (ordered by gyn at [facility]). SBE encouraged Cervical cancer screening: last pap: 2021, results: normal, completed by: GYN- Dr. Matthew Saras.  Immunizations: td- 2019 UTD, Influenza (encouraged yearly), covid series completed Infectious disease screening: HIV completed w/ preg. Hep c completed DEXA: routine screen Assistive device: none Oxygen OHY:WVPX Patient has a Dental home. Hospitalizations/ED visits: reviewed     11/13/2021    1:06 PM 09/30/2020    3:03 PM 09/29/2018    2:34 PM 08/19/2017    2:44 PM  Depression screen PHQ 2/9  Decreased Interest 0 0 0 0  Down, Depressed, Hopeless 0 0 0 0  PHQ - 2 Score 0 0 0 0       No data to display          Immunization History  Administered Date(s) Administered   Influenza-Unspecified 01/05/2017, 01/26/2018, 01/12/2019, 01/04/2020   PFIZER(Purple Top)SARS-COV-2 Vaccination 04/07/2019, 04/27/2019, 03/05/2020   Td 08/19/2017   Past Medical History:  Diagnosis Date   COVID-19 04/2020   Frequent UTI    GERD (gastroesophageal reflux disease)    Heart murmur    History of IBS    Insomnia  08/19/2017   Lymphocytic colitis 2019   Dr. Benson [place]   Urinary incontinence    Allergies  Allergen Reactions   Trazodone And Nefazodone Other (See Comments)    Severe Headache   Sulfa Antibiotics Rash and Hives   Past Surgical History:  Procedure Laterality Date   BREAST REDUCTION SURGERY  2001   CYSTOSCOPY  1980's   multiple.    LIPOSUCTION     Laser Liposuction    MYRINGOTOMY     PANNICULECTOMY N/A 12/25/2015   Procedure: ABDOMINAL SCAR REVISION;  Surgeon: Crissie Reese, MD;  Location: [place];  Service: Plastics;  Laterality: N/A;   REDUCTION MAMMAPLASTY Bilateral 2001   Family History  Problem Relation Age of Onset   Diabetes Other    Hyperlipidemia Other    Hypertension Other    Social History   Social History Narrative   Divorced, 1 child.    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 If you have any questions, ask your nurse or doctor.  STOP taking these medications    B-12 250 MCG Tabs Stopped by: Howard Pouch, DO   Collagen Hydrolysate Powd Stopped by: Howard Pouch, DO   HAIR SKIN AND NAILS FORMULA PO Stopped by: Howard Pouch, DO   hydrOXYzine 25 MG tablet Commonly known as: ATARAX Stopped by: Howard Pouch, DO   Magnesium 500 MG Caps Stopped by: Howard Pouch, DO   nystatin 100000 UNIT/ML suspension Commonly known as: MYCOSTATIN Stopped by: Howard Pouch, DO   Omega 3 1200 MG Caps Stopped by: Howard Pouch, DO   Turmeric 500 MG Caps Stopped by: Howard Pouch, DO       TAKE these medications    [employer] Melatonin Gummies 2.5 MG Chew Generic drug: Melatonin Chew by mouth.   esomeprazole 40 MG capsule Commonly known as: NexIUM Take 1 capsule  (40 mg total) by mouth daily.   famotidine 20 MG tablet Commonly known as: PEPCID famotidine 20 mg tablet  Take 1 tablet every day by oral route at bedtime.   Lactase 9000 units Tabs Take 2 tablets by mouth as needed.   levonorgestrel 20 MCG/24HR IUD Commonly known as: MIRENA 1 each by Intrauterine route once.   OVER THE COUNTER MEDICATION Black Cohosh '540mg'$  BID   PROBIOTIC-10 PO Take by mouth.   SENNA CO by Combination route.   VITAMIN D3 PO Take by mouth. 500 mg daily        All past medical history, surgical history, allergies, family history, immunizations andmedications were updated in the EMR today and reviewed under the history and medication portions of their EMR.     No results found for this or any previous visit (from the past 2160 hour(s)).  MM 3D SCREEN BREAST BILATERAL Result Date: 01/23/2021 IMPRESSION: No mammographic evidence of malignancy. A result letter of this screening mammogram will be mailed directly to the patient. RECOMMENDATION: Screening mammogram in one year. (Code:SM-B-01Y) BI-RADS CATEGORY  1: Negative.    ROS 14 pt review of systems performed and negative (unless mentioned in an HPI)  Objective: BP 126/83   Pulse 92   Temp 98.2 F (36.8 C) (Oral)   Ht '5\' 2"'$  (1.575 m)   Wt 156 lb (70.8 kg)   SpO2 99%   BMI 28.53 kg/m  Physical Exam Vitals and nursing note reviewed.  Constitutional:      General: She is not in acute distress.    Appearance: Normal appearance. She is not ill-appearing or toxic-appearing.  HENT:     Head: Normocephalic and atraumatic.     Right Ear: Tympanic membrane, ear canal and external ear normal. There is no impacted cerumen.     Left Ear: Tympanic membrane, ear canal and external ear normal. There is no impacted cerumen.     Nose: No congestion or rhinorrhea.     Mouth/Throat:     Mouth: Mucous membranes are moist.     Pharynx: Oropharynx is clear. No oropharyngeal exudate or posterior oropharyngeal  erythema.  Eyes:     General: No scleral icterus.       Right eye: No discharge.        Left eye: No discharge.     Extraocular Movements: Extraocular movements intact.     Conjunctiva/sclera: Conjunctivae normal.     Pupils: Pupils are equal, round, and reactive to light.  Cardiovascular:     Rate and Rhythm: Normal rate and regular rhythm.     Pulses: Normal pulses.     Heart sounds: Normal heart sounds. No murmur heard.  No friction rub. No gallop.  Pulmonary:     Effort: Pulmonary effort is normal. No respiratory distress.     Breath sounds: Normal breath sounds. No stridor. No wheezing, rhonchi or rales.  Chest:     Chest wall: No tenderness.  Abdominal:     General: Abdomen is flat. Bowel sounds are normal. There is no distension.     Palpations: Abdomen is soft. There is no mass.     Tenderness: There is no abdominal tenderness. There is no right CVA tenderness, left CVA tenderness, guarding or rebound.     Hernia: No hernia is present.  Musculoskeletal:        General: No swelling, tenderness or deformity. Normal range of motion.     Cervical back: Normal range of motion and neck supple. No rigidity or tenderness.     Right lower leg: No edema.     Left lower leg: No edema.  Lymphadenopathy:     Cervical: No cervical adenopathy.  Skin:    General: Skin is warm and dry.     Coloration: Skin is not jaundiced or pale.     Findings: No bruising, erythema, lesion or rash.  Neurological:     General: No focal deficit present.     Mental Status: She is alert and oriented to person, place, and time. Mental status is at baseline.     Cranial Nerves: No cranial nerve deficit.     Sensory: No sensory deficit.     Motor: No weakness.     Coordination: Coordination normal.     Gait: Gait normal.     Deep Tendon Reflexes: Reflexes normal.  Psychiatric:        Mood and Affect: Mood normal.        Behavior: Behavior normal.        Thought Content: Thought content normal.         Judgment: Judgment normal.      No results found.  Assessment/plan: TECIA CINNAMON is a 41 y.o. female present for CPE  Gastroesophageal reflux disease without esophagitis Taking OTC nexium - Comprehensive metabolic panel - CBC  Encounter for long-term current use of medication - TSH - Lipid panel - Hemoglobin A1c - Comprehensive metabolic panel - CBC  Breast cancer screening by mammogram - MM 3D SCREEN BREAST BILATERAL; Future Routine general medical examination at a health care facility Colonoscopy: no fhx, routine screen 43 Mammogram: No fhx, 01/2021 (ordered by gyn at [facility]). SBE encouraged Cervical cancer screening: last pap: 2021, results: normal, completed by: GYN- Dr. Matthew Saras.  Immunizations: td- 2019 UTD, Influenza (encouraged yearly), covid series completed Infectious disease screening: HIV completed w/ preg. Hep c completed Patient was encouraged to exercise greater than 150 minutes a week. Patient was encouraged to choose a diet filled with fresh fruits and vegetables, and lean meats. AVS provided to patient today for education/recommendation on gender specific health and safety maintenance.  Return in about 1 year (around 11/15/2022) for cpe (20 min).   Orders Placed This Encounter  Procedures   MM 3D SCREEN BREAST BILATERAL   TSH   Lipid panel   Hemoglobin A1c   Comprehensive metabolic panel   CBC   No orders of the defined types were placed in this encounter.  Referral Orders  No referral(s) requested today     Electronically signed by: Howard Pouch, [place]

## 2021-11-13 NOTE — Patient Instructions (Signed)

## 2021-11-26 ENCOUNTER — Other Ambulatory Visit: Payer: Self-pay | Admitting: Podiatry

## 2021-11-26 MED ORDER — ITRACONAZOLE 100 MG PO CAPS: 100.0000 mg | ORAL_CAPSULE | Freq: Two times a day (BID) | ORAL | 0 refills | Status: DC

## 2021-12-17 ENCOUNTER — Other Ambulatory Visit: Payer: Self-pay | Admitting: Family Medicine

## 2021-12-17 DIAGNOSIS — Z1231 Encounter for screening mammogram for malignant neoplasm of breast: Secondary | ICD-10-CM

## 2022-01-22 DIAGNOSIS — Z1231 Encounter for screening mammogram for malignant neoplasm of breast: Secondary | ICD-10-CM

## 2022-03-02 ENCOUNTER — Other Ambulatory Visit: Payer: Self-pay | Admitting: Podiatry

## 2022-03-02 MED ORDER — AZITHROMYCIN 250 MG PO TABS: ORAL_TABLET | ORAL | 1 refills | Status: AC

## 2022-03-02 MED ORDER — FLUCONAZOLE 150 MG PO TABS: 150.0000 mg | ORAL_TABLET | Freq: Once | ORAL | 1 refills | Status: AC

## 2022-05-14 ENCOUNTER — Ambulatory Visit
Admission: RE | Admit: 2022-05-14 | Discharge: 2022-05-14 | Disposition: A | Discharge status: Home / Self Care | Payer: 59 | Source: Ambulatory Visit | Attending: Family Medicine | Admitting: Family Medicine

## 2022-05-14 DIAGNOSIS — Z1231 Encounter for screening mammogram for malignant neoplasm of breast: Secondary | ICD-10-CM | POA: Diagnosis not present

## 2022-10-12 DIAGNOSIS — H5213 Myopia, bilateral: Secondary | ICD-10-CM | POA: Diagnosis not present

## 2022-10-12 DIAGNOSIS — H52203 Unspecified astigmatism, bilateral: Secondary | ICD-10-CM | POA: Diagnosis not present

## 2022-10-13 DIAGNOSIS — Z01419 Encounter for gynecological examination (general) (routine) without abnormal findings: Secondary | ICD-10-CM | POA: Diagnosis not present

## 2022-10-13 DIAGNOSIS — Z6830 Body mass index (BMI) 30.0-30.9, adult: Secondary | ICD-10-CM | POA: Diagnosis not present

## 2022-11-03 ENCOUNTER — Encounter (INDEPENDENT_AMBULATORY_CARE_PROVIDER_SITE_OTHER): Payer: Self-pay

## 2022-11-22 ENCOUNTER — Ambulatory Visit (INDEPENDENT_AMBULATORY_CARE_PROVIDER_SITE_OTHER): Payer: 59 | Admitting: Family Medicine

## 2022-11-22 ENCOUNTER — Encounter: Payer: Self-pay | Admitting: Family Medicine

## 2022-11-22 DIAGNOSIS — Z131 Encounter for screening for diabetes mellitus: Secondary | ICD-10-CM | POA: Diagnosis not present

## 2022-11-22 DIAGNOSIS — Z13220 Encounter for screening for lipoid disorders: Secondary | ICD-10-CM

## 2022-11-22 DIAGNOSIS — Z0000 Encounter for general adult medical examination without abnormal findings: Secondary | ICD-10-CM | POA: Diagnosis not present

## 2022-11-22 LAB — TSH: TSH: 2.04 u[IU]/mL (ref 0.35–5.50)

## 2022-11-22 LAB — CBC WITH DIFFERENTIAL/PLATELET
Basophils Absolute: 0.1 10*3/uL (ref 0.0–0.1)
Basophils Relative: 0.7 % (ref 0.0–3.0)
Eosinophils Absolute: 0.4 10*3/uL (ref 0.0–0.7)
Eosinophils Relative: 5.5 % — ABNORMAL HIGH (ref 0.0–5.0)
HCT: 42.5 % (ref 36.0–46.0)
Hemoglobin: 14.3 g/dL (ref 12.0–15.0)
Lymphocytes Relative: 31.3 % (ref 12.0–46.0)
Lymphs Abs: 2.4 10*3/uL (ref 0.7–4.0)
MCHC: 33.6 g/dL (ref 30.0–36.0)
MCV: 91.8 fl (ref 78.0–100.0)
Monocytes Absolute: 0.4 10*3/uL (ref 0.1–1.0)
Monocytes Relative: 5.1 % (ref 3.0–12.0)
Neutro Abs: 4.4 10*3/uL (ref 1.4–7.7)
Neutrophils Relative %: 57.4 % (ref 43.0–77.0)
Platelets: 293 10*3/uL (ref 150.0–400.0)
RBC: 4.63 Mil/uL (ref 3.87–5.11)
RDW: 13.3 % (ref 11.5–15.5)
WBC: 7.7 10*3/uL (ref 4.0–10.5)

## 2022-11-22 LAB — HEMOGLOBIN A1C: Hgb A1c MFr Bld: 5.2 % (ref 4.6–6.5)

## 2022-11-22 NOTE — Patient Instructions (Addendum)
Return in about 1 year (around 11/23/2023) for cpe (20 min).        Great to see you today.  I have refilled the medication(s) we provide.   If labs were collected or images ordered, we will inform you of  results once we have received them and reviewed. We will contact you either by echart message, or telephone call.  Please give ample time to the testing facility, and our office to run,  receive and review results. Please do not call inquiring of results, even if you can see them in your chart. We will contact you as soon as we are able. If it has been over 1 week since the test was completed, and you have not yet heard from [place], then please call us.    - echart message- for normal results that have been seen by the patient already.   - telephone call: abnormal results or if patient has not viewed results in their echart.  If a referral to a specialist was entered for you, please call us in 2 weeks if you have not heard from the specialist office to schedule.

## 2022-11-22 NOTE — Progress Notes (Signed)
Patient ID: Katelyn Mccoy, female  DOB: 1980/07/21, 42 y.o.   MRN: [id number] Patient Care Team    Relationship Specialty Notifications Start End  Natalia Leatherwood, DO PCP - General Family Medicine  08/19/17   Richarda Overlie, MD Consulting Physician Obstetrics and Gynecology  08/19/17   Jeani Hawking, MD Consulting Physician Gastroenterology  09/29/18     Chief Complaint  Patient presents with   Annual Exam    Subjective: Katelyn Mccoy is a 42 y.o.  Female  present for CPE All past medical history, surgical history, allergies, family history, immunizations, medications and social history were updated in the electronic medical record today. All recent labs, ED visits and hospitalizations within the last year were reviewed.  Health maintenance:  Colonoscopy: no fhx, routine screen 43 Mammogram: No fhx, completed 05/2022 (ordered by gyn at [facility]). SBE encouraged Cervical cancer screening: last pap: 10/2022, 1 year (HPV+)results: normal, completed by: GYN- Dr. Marcelle Mccoy.  Immunizations: td- 2019 UTD, Influenza (encouraged yearly), covid series completed Infectious disease screening: HIV completed w/ preg. Hep c completed DEXA: routine screen Assistive device: none Oxygen [id number] Patient has a Dental home. Hospitalizations/ED visits: reviewed     11/22/2022    1:11 PM 11/13/2021    1:06 PM 09/30/2020    3:03 PM 09/29/2018    2:34 PM 08/19/2017    2:44 PM  Depression screen PHQ 2/9  Decreased Interest 0 0 0 0 0  Down, Depressed, Hopeless 0 0 0 0 0  PHQ - 2 Score 0 0 0 0 0      11/22/2022    1:11 PM  GAD 7 : Generalized Anxiety Score  Nervous, Anxious, on Edge 0  Control/stop worrying 0  Worry too much - different things 1  Trouble relaxing 1  Restless 0  Easily annoyed or irritable 0  Afraid - awful might happen 0  Total GAD 7 Score 2  Anxiety Difficulty Not difficult at all    Immunization History  Administered Date(s) Administered   Influenza-Unspecified  01/05/2017, 01/26/2018, 01/12/2019, 01/04/2020   PFIZER(Purple Top)SARS-COV-2 Vaccination 04/07/2019, 04/27/2019, 03/05/2020   Td 08/19/2017   Past Medical History:  Diagnosis Date   COVID-19 04/2020   Frequent UTI    GERD (gastroesophageal reflux disease)    Heart murmur    History of IBS    Insomnia 08/19/2017   Lymphocytic colitis 2019   Dr. Elnoria Howard   Urinary incontinence    Allergies  Allergen Reactions   Trazodone And Nefazodone Other (See Comments)    Severe Headache   Sulfa Antibiotics Rash and Hives   Past Surgical History:  Procedure Laterality Date   BREAST REDUCTION SURGERY  2001   CYSTOSCOPY  1980's   multiple.    LIPOSUCTION     Laser Liposuction    MYRINGOTOMY     PANNICULECTOMY N/A 12/25/2015   Procedure: ABDOMINAL SCAR REVISION;  Surgeon: Etter Sjogren, MD;  Location: [facility] OR;  Service: Plastics;  Laterality: N/A;   REDUCTION MAMMAPLASTY Bilateral 2001   Family History  Problem Relation Age of Onset   Diabetes Other    Hyperlipidemia Other    Hypertension Other    Social History   Social History Narrative   Divorced, 1 child.    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 If you have any questions, ask your nurse or doctor.          STOP taking these medications    Lactase 9000 units Tabs Stopped by: Felix Pacini   OVER THE COUNTER MEDICATION Stopped by: Felix Pacini   PROBIOTIC-10 PO Stopped by: Felix Pacini   SENNA CO Stopped by: Felix Pacini       TAKE these medications    [employer] Melatonin Gummies 2.5 MG Chew Generic drug: Melatonin Chew by mouth.   esomeprazole 40 MG capsule Commonly known as: NexIUM Take 1 capsule (40 mg total) by  mouth daily.   famotidine 20 MG tablet Commonly known as: PEPCID famotidine 20 mg tablet  Take 1 tablet every day by oral route at bedtime.   itraconazole 100 MG capsule Commonly known as: Sporanox Take 1 capsule (100 mg total) by mouth 2 (two) times daily.   levonorgestrel 20 MCG/24HR IUD Commonly known as: MIRENA 1 each by Intrauterine route once.   VITAMIN D3 PO Take by mouth. 500 mg daily        All past medical history, surgical history, allergies, family history, immunizations andmedications were updated in the EMR today and reviewed under the history and medication portions of their EMR.     No results found for this or any previous visit (from the past 2160 hour(s)).   ROS 14 pt review of systems performed and negative (unless mentioned in an HPI)  Objective: BP 119/83   Pulse 73   Temp 97.9 F (36.6 C) (Oral)   Ht 5\' 3"  (1.6 m)   Wt 171 lb 3.2 oz (77.7 kg)   SpO2 99%   BMI 30.33 kg/m  Physical Exam Vitals and nursing note reviewed.  Constitutional:      General: She is not in acute distress.    Appearance: Normal appearance. She is not ill-appearing or toxic-appearing.  HENT:     Head: Normocephalic and atraumatic.     Right Ear: Tympanic membrane, ear canal and external ear normal. There is no impacted cerumen.     Left Ear: Tympanic membrane, ear canal and external ear normal. There is no impacted cerumen.     Nose: No congestion or rhinorrhea.     Mouth/Throat:     Mouth: Mucous membranes are moist.     Pharynx: Oropharynx is clear. No oropharyngeal exudate or posterior oropharyngeal erythema.  Eyes:     General: No scleral icterus.       Right eye: No discharge.        Left eye: No discharge.     Extraocular Movements: Extraocular movements intact.     Conjunctiva/sclera: Conjunctivae normal.     Pupils: Pupils are equal, round, and reactive to light.  Cardiovascular:     Rate and Rhythm: Normal rate and regular rhythm.     Pulses: Normal  pulses.     Heart sounds: Normal heart sounds. No murmur heard.    No friction rub. No gallop.  Pulmonary:     Effort: Pulmonary effort is normal. No respiratory distress.     Breath sounds: Normal breath sounds. No stridor. No wheezing, rhonchi or rales.  Chest:     Chest wall: No tenderness.  Abdominal:     General: Abdomen is  flat. Bowel sounds are normal. There is no distension.     Palpations: Abdomen is soft. There is no mass.     Tenderness: There is no abdominal tenderness. There is no right CVA tenderness, left CVA tenderness, guarding or rebound.     Hernia: No hernia is present.  Musculoskeletal:        General: No swelling, tenderness or deformity. Normal range of motion.     Cervical back: Normal range of motion and neck supple. No rigidity or tenderness.     Right lower leg: No edema.     Left lower leg: No edema.  Lymphadenopathy:     Cervical: No cervical adenopathy.  Skin:    General: Skin is warm and dry.     Coloration: Skin is not jaundiced or pale.     Findings: No bruising, erythema, lesion or rash.  Neurological:     General: No focal deficit present.     Mental Status: She is alert and oriented to person, place, and time. Mental status is at baseline.     Cranial Nerves: No cranial nerve deficit.     Sensory: No sensory deficit.     Motor: No weakness.     Coordination: Coordination normal.     Gait: Gait normal.     Deep Tendon Reflexes: Reflexes normal.  Psychiatric:        Mood and Affect: Mood normal.        Behavior: Behavior normal.        Thought Content: Thought content normal.        Judgment: Judgment normal.      No results found.  Assessment/plan: Katelyn Mccoy is a 42 y.o. female present for CPE  Routine general medical examination at a health care facility Colonoscopy: no fhx, routine screen 43 Mammogram: No fhx, completed 05/2022 (ordered by gyn at [facility] SBE encouraged Cervical cancer screening: last pap: 2021, results: normal,  completed by: GYN- Dr. Marcelle Mccoy.  Immunizations: td- 2019 UTD, Influenza (encouraged yearly), covid series completed Infectious disease screening: HIV completed w/ preg. Hep c completed DEXA: routine screen Patient was encouraged to exercise greater than 150 minutes a week. Patient was encouraged to choose a diet filled with fresh fruits and vegetables, and lean meats. AVS provided to patient today for education/recommendation on gender specific health and safety maintenance.  Return in about 1 year (around 11/23/2023) for cpe (20 min).   Orders Placed This Encounter  Procedures   CBC with Differential/Platelet   Comprehensive metabolic panel   TSH   Hemoglobin A1c   Lipid panel   No orders of the defined types were placed in this encounter.  Referral Orders  No referral(s) requested today     Electronically signed by: Felix Pacini, DO [facility] [place]

## 2022-11-23 LAB — LIPID PANEL
Cholesterol: 188 mg/dL (ref 0–200)
HDL: 71.6 mg/dL (ref >39.00)
LDL Cholesterol: 101 mg/dL — ABNORMAL HIGH (ref 0–99)
NonHDL: 115.93
Total CHOL/HDL Ratio: 3
Triglycerides: 77 mg/dL (ref 0.0–149.0)
VLDL: 15.4 mg/dL (ref 0.0–40.0)

## 2022-11-23 LAB — COMPREHENSIVE METABOLIC PANEL
ALT: 16 U/L (ref 0–35)
AST: 17 U/L (ref 0–37)
Albumin: 4.6 g/dL (ref 3.5–5.2)
Alkaline Phosphatase: 76 U/L (ref 39–117)
BUN: 8 mg/dL (ref 6–23)
CO2: 26 meq/L (ref 19–32)
Calcium: 9.9 mg/dL (ref 8.4–10.5)
Chloride: 103 meq/L (ref 96–112)
Creatinine, Ser: 0.81 mg/dL (ref 0.40–1.20)
GFR: 88.63 mL/min (ref >60.00)
Glucose, Bld: 96 mg/dL (ref 70–99)
Potassium: 3.8 meq/L (ref 3.5–5.1)
Sodium: 140 meq/L (ref 135–145)
Total Bilirubin: 0.8 mg/dL (ref 0.2–1.2)
Total Protein: 6.8 g/dL (ref 6.0–8.3)

## 2023-01-04 ENCOUNTER — Other Ambulatory Visit: Payer: Self-pay | Admitting: Podiatry

## 2023-01-04 MED ORDER — ITRACONAZOLE 100 MG PO CAPS: 100.0000 mg | ORAL_CAPSULE | Freq: Two times a day (BID) | ORAL | 0 refills | Status: DC

## 2023-04-01 ENCOUNTER — Other Ambulatory Visit: Payer: Self-pay | Admitting: Obstetrics and Gynecology

## 2023-04-01 DIAGNOSIS — Z1231 Encounter for screening mammogram for malignant neoplasm of breast: Secondary | ICD-10-CM

## 2023-05-17 ENCOUNTER — Ambulatory Visit
Admission: RE | Admit: 2023-05-17 | Discharge: 2023-05-17 | Disposition: A | Discharge status: Home / Self Care | Payer: 59 | Source: Ambulatory Visit

## 2023-05-17 DIAGNOSIS — Z1231 Encounter for screening mammogram for malignant neoplasm of breast: Secondary | ICD-10-CM

## 2023-11-23 DIAGNOSIS — K219 Gastro-esophageal reflux disease without esophagitis: Secondary | ICD-10-CM | POA: Diagnosis not present

## 2023-11-23 DIAGNOSIS — R140 Abdominal distension (gaseous): Secondary | ICD-10-CM | POA: Diagnosis not present

## 2023-11-23 DIAGNOSIS — R1084 Generalized abdominal pain: Secondary | ICD-10-CM | POA: Diagnosis not present

## 2023-11-23 DIAGNOSIS — K5900 Constipation, unspecified: Secondary | ICD-10-CM | POA: Diagnosis not present

## 2023-11-24 ENCOUNTER — Ambulatory Visit (INDEPENDENT_AMBULATORY_CARE_PROVIDER_SITE_OTHER): Payer: 59 | Admitting: Family Medicine

## 2023-11-24 ENCOUNTER — Encounter: Payer: Self-pay | Admitting: Family Medicine

## 2023-11-24 VITALS — BP 118/76 | HR 80 | Temp 98.1°F | Wt 169.0 lb

## 2023-11-24 DIAGNOSIS — Z0000 Encounter for general adult medical examination without abnormal findings: Secondary | ICD-10-CM

## 2023-11-24 DIAGNOSIS — E663 Overweight: Secondary | ICD-10-CM

## 2023-11-24 DIAGNOSIS — Z131 Encounter for screening for diabetes mellitus: Secondary | ICD-10-CM

## 2023-11-24 DIAGNOSIS — Z13220 Encounter for screening for lipoid disorders: Secondary | ICD-10-CM | POA: Diagnosis not present

## 2023-11-24 LAB — CBC
HCT: 42.2 % (ref 36.0–46.0)
Hemoglobin: 14.2 g/dL (ref 12.0–15.0)
MCHC: 33.7 g/dL (ref 30.0–36.0)
MCV: 91.5 fl (ref 78.0–100.0)
Platelets: 286 K/uL (ref 150.0–400.0)
RBC: 4.62 Mil/uL (ref 3.87–5.11)
RDW: 13.3 % (ref 11.5–15.5)
WBC: 7.5 K/uL (ref 4.0–10.5)

## 2023-11-24 LAB — COMPREHENSIVE METABOLIC PANEL WITH GFR
ALT: 17 U/L (ref 0–35)
AST: 19 U/L (ref 0–37)
Albumin: 4.4 g/dL (ref 3.5–5.2)
Alkaline Phosphatase: 68 U/L (ref 39–117)
BUN: 8 mg/dL (ref 6–23)
CO2: 26 meq/L (ref 19–32)
Calcium: 9.5 mg/dL (ref 8.4–10.5)
Chloride: 103 meq/L (ref 96–112)
Creatinine, Ser: 0.77 mg/dL (ref 0.40–1.20)
GFR: 93.52 mL/min (ref >60.00)
Glucose, Bld: 84 mg/dL (ref 70–99)
Potassium: 4.3 meq/L (ref 3.5–5.1)
Sodium: 139 meq/L (ref 135–145)
Total Bilirubin: 0.6 mg/dL (ref 0.2–1.2)
Total Protein: 6.4 g/dL (ref 6.0–8.3)

## 2023-11-24 LAB — HEMOGLOBIN A1C: Hgb A1c MFr Bld: 5.2 % (ref 4.6–6.5)

## 2023-11-24 LAB — LIPID PANEL
Cholesterol: 157 mg/dL (ref 0–200)
HDL: 57.1 mg/dL (ref >39.00)
LDL Cholesterol: 88 mg/dL (ref 0–99)
NonHDL: 99.88
Total CHOL/HDL Ratio: 3
Triglycerides: 59 mg/dL (ref 0.0–149.0)
VLDL: 11.8 mg/dL (ref 0.0–40.0)

## 2023-11-24 LAB — TSH: TSH: 1.95 u[IU]/mL (ref 0.35–5.50)

## 2023-11-24 NOTE — Progress Notes (Signed)
 Patient ID: Katelyn Mccoy, female  DOB: [birth year], 43 y.o.   MRN: [id number] Patient Care Team    Relationship Specialty Notifications Start End  Catherine Charlies LABOR, DO PCP - General Family Medicine  08/19/17   Johnnye Ade, MD Consulting Physician Obstetrics and Gynecology  08/19/17   Rollin Dover, MD Consulting Physician Gastroenterology  09/29/18     Chief Complaint  Patient presents with   Annual Exam    Pt is fasting.     Subjective: Katelyn Mccoy is a 43 y.o.  Female  present for CPE All past medical history, surgical history, allergies, family history, immunizations, medications and social history were updated in the electronic medical record today. All recent labs, ED visits and hospitalizations within the last year were reviewed.  Health maintenance:  Colonoscopy: no fhx, routine screen 43, Dr. Rollin GI Mammogram: No fhx, completed 05/2023 (ordered by gyn at [facility]). SBE encouraged- gyn orders Cervical cancer screening: last pap: 10/2022, 1 year (HPV+)results: normal, completed by: GYN- Dr. Johnnye.She is scheduling Immunizations: td- 2019 UTD, Influenza (encouraged yearly Infectious disease screening: HIV completed w/ preg. Hep c completed DEXA: routine screen 60-65  Assistive device: none Oxygen [id number] Patient has a Dental home. Hospitalizations/ED visits: Reviewed     11/24/2023    8:13 AM 11/22/2022    1:11 PM 11/13/2021    1:06 PM 09/30/2020    3:03 PM 09/29/2018    2:34 PM  Depression screen PHQ 2/9  Decreased Interest 0 0 0 0 0  Down, Depressed, Hopeless 0 0 0 0 0  PHQ - 2 Score 0 0 0 0 0      11/22/2022    1:11 PM  GAD 7 : Generalized Anxiety Score  Nervous, Anxious, on Edge 0  Control/stop worrying 0  Worry too much - different things 1  Trouble relaxing 1  Restless 0  Easily annoyed or irritable 0  Afraid - awful might happen 0  Total GAD 7 Score 2  Anxiety Difficulty Not difficult at all    Immunization History  Administered Date(s)  Administered   Influenza-Unspecified 01/05/2017, 01/26/2018, 01/12/2019, 01/04/2020   PFIZER(Purple Top)SARS-COV-2 Vaccination 04/07/2019, 04/27/2019, 03/05/2020   Td 08/19/2017   Past Medical History:  Diagnosis Date   COVID-19 04/2020   Frequent UTI    GERD (gastroesophageal reflux disease)    Heart murmur    History of IBS    Insomnia 08/19/2017   Lymphocytic colitis 2019   Dr. Rollin   Urinary incontinence    Allergies  Allergen Reactions   Trazodone  And Nefazodone Other (See Comments)    Severe Headache   Sulfa Antibiotics Rash and Hives   Past Surgical History:  Procedure Laterality Date   BREAST REDUCTION SURGERY  2001   CYSTOSCOPY  1980's   multiple.    LIPOSUCTION     Laser Liposuction    MYRINGOTOMY     PANNICULECTOMY N/A 12/25/2015   Procedure: ABDOMINAL SCAR REVISION;  Surgeon: Alm Sick, MD;  Location: [facility] OR;  Service: Plastics;  Laterality: N/A;   REDUCTION MAMMAPLASTY Bilateral 2001   Family History  Problem Relation Age of Onset   Diabetes Other    Hyperlipidemia Other    Hypertension Other    Social History   Social History Narrative   Divorced, 1 child.    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 If you have any questions, ask your nurse or doctor.          STOP taking these medications    itraconazole  100 MG capsule Commonly known as: Sporanox  Stopped by: Charlies Bellini       TAKE these medications    [employer] Melatonin Gummies 2.5 MG Chew Generic drug: Melatonin Chew by mouth.   esomeprazole  40 MG capsule Commonly known as: NexIUM  Take 1 capsule (40 mg total) by mouth daily.   famotidine 20 MG tablet Commonly known as:  PEPCID famotidine 20 mg tablet  Take 1 tablet every day by oral route at bedtime.   levonorgestrel 20 MCG/24HR IUD Commonly known as: MIRENA 1 each by Intrauterine route once.   tolterodine 4 MG 24 hr capsule Commonly known as: DETROL LA Take 4 mg by mouth daily.   VITAMIN D3 PO Take by mouth. 500 mg daily        All past medical history, surgical history, allergies, family history, immunizations andmedications were updated in the EMR today and reviewed under the history and medication portions of their EMR.     No results found for this or any previous visit (from the past 2160 hours).   ROS 14 pt review of systems performed and negative (unless mentioned in an HPI)  Objective: BP 118/76   Pulse 80   Temp 98.1 F (36.7 C)   Wt 169 lb (76.7 kg)   SpO2 98%   BMI 29.94 kg/m  Physical Exam Vitals and nursing note reviewed.  Constitutional:      General: She is not in acute distress.    Appearance: Normal appearance. She is not ill-appearing or toxic-appearing.  HENT:     Head: Normocephalic and atraumatic.     Right Ear: Tympanic membrane, ear canal and external ear normal. There is no impacted cerumen.     Left Ear: Tympanic membrane, ear canal and external ear normal. There is no impacted cerumen.     Nose: No congestion or rhinorrhea.     Mouth/Throat:     Mouth: Mucous membranes are moist.     Pharynx: Oropharynx is clear. No oropharyngeal exudate or posterior oropharyngeal erythema.  Eyes:     General: No scleral icterus.       Right eye: No discharge.        Left eye: No discharge.     Extraocular Movements: Extraocular movements intact.     Conjunctiva/sclera: Conjunctivae normal.     Pupils: Pupils are equal, round, and reactive to light.  Cardiovascular:     Rate and Rhythm: Normal rate and regular rhythm.     Pulses: Normal pulses.     Heart sounds: Normal heart sounds. No murmur heard.    No friction rub. No gallop.  Pulmonary:     Effort:  Pulmonary effort is normal. No respiratory distress.     Breath sounds: Normal breath sounds. No stridor. No wheezing, rhonchi or rales.  Chest:     Chest wall: No tenderness.  Abdominal:     General: Abdomen is flat. Bowel sounds are normal. There is no distension.     Palpations: Abdomen is soft. There is no mass.  Tenderness: There is no abdominal tenderness. There is no right CVA tenderness, left CVA tenderness, guarding or rebound.     Hernia: No hernia is present.  Musculoskeletal:        General: No swelling, tenderness or deformity. Normal range of motion.     Cervical back: Normal range of motion and neck supple. No rigidity or tenderness.     Right lower leg: No edema.     Left lower leg: No edema.  Lymphadenopathy:     Cervical: No cervical adenopathy.  Skin:    General: Skin is warm and dry.     Coloration: Skin is not jaundiced or pale.     Findings: No bruising, erythema, lesion or rash.  Neurological:     General: No focal deficit present.     Mental Status: She is alert and oriented to person, place, and time. Mental status is at baseline.     Cranial Nerves: No cranial nerve deficit.     Sensory: No sensory deficit.     Motor: No weakness.     Coordination: Coordination normal.     Gait: Gait normal.     Deep Tendon Reflexes: Reflexes normal.  Psychiatric:        Mood and Affect: Mood normal.        Behavior: Behavior normal.        Thought Content: Thought content normal.        Judgment: Judgment normal.      No results found.  Assessment/plan: Katelyn Mccoy is a 43 y.o. female present for CPE  Routine general medical examination at a health care facility Colonoscopy: no fhx, routine screen 43, Dr. Rollin GI Mammogram: No fhx, completed 05/2023 (ordered by gyn at [facility]). SBE encouraged- gyn orders Cervical cancer screening: last pap: 10/2022, 1 year (HPV+)results: normal, completed by: GYN- Dr. Johnnye.She is scheduling Immunizations: td- 2019 UTD,  Influenza (encouraged yearly Infectious disease screening: HIV completed w/ preg. Hep c completed DEXA: routine screen 60-65  Patient was encouraged to exercise greater than 150 minutes a week. Patient was encouraged to choose a diet filled with fresh fruits and vegetables, and lean meats. AVS provided to patient today for education/recommendation on gender specific health and safety maintenance.   Return in about 1 year (around 11/24/2024) for cpe (20 min).   Orders Placed This Encounter  Procedures   CBC   Comprehensive metabolic panel with GFR   Hemoglobin A1c   TSH   Lipid panel   No orders of the defined types were placed in this encounter.  Referral Orders  No referral(s) requested today     Electronically signed by: Charlies Bellini, DO [facility] [place]

## 2023-11-24 NOTE — Patient Instructions (Addendum)

## 2023-11-25 ENCOUNTER — Ambulatory Visit: Payer: Self-pay | Admitting: Family Medicine

## 2023-12-26 DIAGNOSIS — K219 Gastro-esophageal reflux disease without esophagitis: Secondary | ICD-10-CM | POA: Diagnosis not present

## 2023-12-26 DIAGNOSIS — K5900 Constipation, unspecified: Secondary | ICD-10-CM | POA: Diagnosis not present

## 2024-03-26 ENCOUNTER — Other Ambulatory Visit: Payer: Self-pay | Admitting: Podiatry

## 2024-03-26 MED ORDER — AZITHROMYCIN 250 MG PO TABS: ORAL_TABLET | ORAL | 1 refills | Status: AC

## 2024-04-23 ENCOUNTER — Other Ambulatory Visit: Payer: Self-pay | Admitting: Family Medicine

## 2024-04-23 DIAGNOSIS — Z1231 Encounter for screening mammogram for malignant neoplasm of breast: Secondary | ICD-10-CM

## 2024-05-18 ENCOUNTER — Ambulatory Visit

## 2024-05-18 DIAGNOSIS — Z1231 Encounter for screening mammogram for malignant neoplasm of breast: Secondary | ICD-10-CM

## 2024-11-26 ENCOUNTER — Encounter: Admitting: Family Medicine
